# Patient Record
Sex: Female | Born: 1980
Health system: Southern US, Community
[De-identification: ages and names within clinical notes are randomized; demographics above are authoritative.]

## PROBLEM LIST (undated history)

## (undated) DIAGNOSIS — R519 Headache, unspecified: Secondary | ICD-10-CM

## (undated) DIAGNOSIS — Z87442 Personal history of urinary calculi: Secondary | ICD-10-CM

## (undated) DIAGNOSIS — D649 Anemia, unspecified: Secondary | ICD-10-CM

## (undated) DIAGNOSIS — N393 Stress incontinence (female) (male): Secondary | ICD-10-CM

## (undated) DIAGNOSIS — I10 Essential (primary) hypertension: Secondary | ICD-10-CM

## (undated) DIAGNOSIS — F419 Anxiety disorder, unspecified: Secondary | ICD-10-CM

## (undated) DIAGNOSIS — R55 Syncope and collapse: Secondary | ICD-10-CM

## (undated) DIAGNOSIS — I2699 Other pulmonary embolism without acute cor pulmonale: Secondary | ICD-10-CM

## (undated) HISTORY — DX: Essential (primary) hypertension: I10

## (undated) HISTORY — DX: Syncope and collapse: R55

## (undated) HISTORY — PX: TUBAL LIGATION: SHX77

## (undated) HISTORY — DX: Headache, unspecified: R51.9

---
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
## 1998-01-18 ENCOUNTER — Inpatient Hospital Stay (HOSPITAL_COMMUNITY): Admission: AD | Admit: 1998-01-18 | Discharge: 1998-01-18 | Payer: Self-pay | Admitting: Obstetrics

## 1998-01-31 ENCOUNTER — Inpatient Hospital Stay (HOSPITAL_COMMUNITY): Admission: AD | Admit: 1998-01-31 | Discharge: 1998-02-03 | Payer: Self-pay | Admitting: *Deleted

## 1998-01-31 ENCOUNTER — Inpatient Hospital Stay (HOSPITAL_COMMUNITY): Admission: AD | Admit: 1998-01-31 | Discharge: 1998-01-31 | Payer: Self-pay | Admitting: *Deleted

## 1998-02-06 ENCOUNTER — Inpatient Hospital Stay (HOSPITAL_COMMUNITY): Admission: AD | Admit: 1998-02-06 | Discharge: 1998-02-06 | Payer: Self-pay | Admitting: Obstetrics

## 2000-05-09 ENCOUNTER — Emergency Department (HOSPITAL_COMMUNITY): Admission: EM | Admit: 2000-05-09 | Discharge: 2000-05-09 | Payer: Self-pay | Admitting: Emergency Medicine

## 2000-05-18 ENCOUNTER — Emergency Department (HOSPITAL_COMMUNITY): Admission: EM | Admit: 2000-05-18 | Discharge: 2000-05-18 | Payer: Self-pay | Admitting: Emergency Medicine

## 2000-10-09 ENCOUNTER — Encounter: Payer: Self-pay | Admitting: Emergency Medicine

## 2000-10-09 ENCOUNTER — Emergency Department (HOSPITAL_COMMUNITY): Admission: EM | Admit: 2000-10-09 | Discharge: 2000-10-10 | Payer: Self-pay | Admitting: Emergency Medicine

## 2001-01-02 ENCOUNTER — Emergency Department (HOSPITAL_COMMUNITY): Admission: EM | Admit: 2001-01-02 | Discharge: 2001-01-02 | Payer: Self-pay | Admitting: Emergency Medicine

## 2001-06-12 ENCOUNTER — Inpatient Hospital Stay (HOSPITAL_COMMUNITY): Admission: AD | Admit: 2001-06-12 | Discharge: 2001-06-12 | Payer: Self-pay | Admitting: *Deleted

## 2001-06-30 ENCOUNTER — Encounter: Payer: Self-pay | Admitting: *Deleted

## 2001-06-30 ENCOUNTER — Inpatient Hospital Stay (HOSPITAL_COMMUNITY): Admission: AD | Admit: 2001-06-30 | Discharge: 2001-06-30 | Payer: Self-pay | Admitting: *Deleted

## 2001-10-22 ENCOUNTER — Encounter (INDEPENDENT_AMBULATORY_CARE_PROVIDER_SITE_OTHER): Payer: Self-pay | Admitting: *Deleted

## 2001-10-22 ENCOUNTER — Inpatient Hospital Stay (HOSPITAL_COMMUNITY): Admission: AD | Admit: 2001-10-22 | Discharge: 2001-10-24 | Payer: Self-pay | Admitting: *Deleted

## 2002-04-14 ENCOUNTER — Emergency Department (HOSPITAL_COMMUNITY): Admission: EM | Admit: 2002-04-14 | Discharge: 2002-04-14 | Payer: Self-pay

## 2002-08-12 ENCOUNTER — Emergency Department (HOSPITAL_COMMUNITY): Admission: EM | Admit: 2002-08-12 | Discharge: 2002-08-13 | Payer: Self-pay | Admitting: Emergency Medicine

## 2002-08-13 ENCOUNTER — Emergency Department (HOSPITAL_COMMUNITY): Admission: EM | Admit: 2002-08-13 | Discharge: 2002-08-14 | Payer: Self-pay | Admitting: Emergency Medicine

## 2003-10-20 ENCOUNTER — Ambulatory Visit (HOSPITAL_COMMUNITY): Admission: RE | Admit: 2003-10-20 | Discharge: 2003-10-20 | Payer: Self-pay | Admitting: *Deleted

## 2004-02-12 HISTORY — PX: TUBAL LIGATION: SHX77

## 2004-03-20 ENCOUNTER — Encounter (INDEPENDENT_AMBULATORY_CARE_PROVIDER_SITE_OTHER): Payer: Self-pay | Admitting: *Deleted

## 2004-03-20 ENCOUNTER — Inpatient Hospital Stay (HOSPITAL_COMMUNITY): Admission: AD | Admit: 2004-03-20 | Discharge: 2004-03-22 | Payer: Self-pay | Admitting: *Deleted

## 2004-03-20 ENCOUNTER — Ambulatory Visit: Payer: Self-pay | Admitting: *Deleted

## 2006-02-11 HISTORY — PX: BREAST ENHANCEMENT SURGERY: SHX7

## 2006-02-11 HISTORY — PX: AUGMENTATION MAMMAPLASTY: SUR837

## 2006-03-07 ENCOUNTER — Other Ambulatory Visit: Admission: RE | Admit: 2006-03-07 | Discharge: 2006-03-07 | Payer: Self-pay | Admitting: Family Medicine

## 2006-10-07 ENCOUNTER — Emergency Department (HOSPITAL_COMMUNITY): Admission: EM | Admit: 2006-10-07 | Discharge: 2006-10-07 | Payer: Self-pay | Admitting: Emergency Medicine

## 2006-11-03 ENCOUNTER — Emergency Department (HOSPITAL_COMMUNITY): Admission: EM | Admit: 2006-11-03 | Discharge: 2006-11-03 | Payer: Self-pay | Admitting: Emergency Medicine

## 2008-01-20 ENCOUNTER — Emergency Department (HOSPITAL_COMMUNITY): Admission: EM | Admit: 2008-01-20 | Discharge: 2008-01-21 | Payer: Self-pay | Admitting: Emergency Medicine

## 2009-01-18 ENCOUNTER — Other Ambulatory Visit: Admission: RE | Admit: 2009-01-18 | Discharge: 2009-01-18 | Payer: Self-pay | Admitting: Family Medicine

## 2009-10-20 ENCOUNTER — Other Ambulatory Visit: Admission: RE | Admit: 2009-10-20 | Discharge: 2009-10-20 | Payer: Self-pay | Admitting: Obstetrics and Gynecology

## 2010-06-29 NOTE — Op Note (Signed)
NAMEJEFFRIE, STANDER                  ACCOUNT NO.:  [id number]   MEDICAL RECORD NO.:  [id number]          PATIENT TYPE:  INP   LOCATION:  9136                          FACILITY:  [facility]   PHYSICIAN:  Conni Elliot, M.D.DATE OF BIRTH:  [birth year]   DATE OF PROCEDURE:  03/20/2004  DATE OF DISCHARGE:                                 OPERATIVE REPORT   PREOPERATIVE DIAGNOSIS:  Desire for surgical sterilization.   POSTOPERATIVE DIAGNOSIS:  Desire for surgical sterilization.   OPERATION:  Modified bilateral Pomeroy tubal ligation.   OPERATOR:  Conni Elliot, M.D.   ANESTHESIA:  Continuous lumbar epidural.   OPERATIVE FINDINGS:  Uterus and tubes were normal for postgravid state.   DESCRIPTION OF PROCEDURE:  After placing the patient under continuous lumbar  epidural anesthetic, the patient supine, abdomen was prepped and draped in a  sterile fashion.  Bladder was emptied by transurethral cath.  The abdomen  was entered through a periumbilical incision.  Incision was made through the  skin through layers of fascia.  Peritoneal cavity entered.  The right  fallopian tube was identified and grasped with a Babcock clamp, followed to  its fimbriated end.  A segment of tube was brought into the operative field,  doubly suture ligated, and an approximately 2 cm segment was excised.  Hemostasis was adequate.  A similar procedure done on the opposite side.  Hemostasis again adequate.  Anterior peritoneum, fascia, and skin were  closed in routine fashion.  Estimated blood loss less than 10 mL.  Instrument counts correct.      ASG/MEDQ  D:  03/20/2004  T:  03/20/2004  Job:  [id number]

## 2010-10-04 ENCOUNTER — Other Ambulatory Visit: Payer: Self-pay | Admitting: Family Medicine

## 2010-10-04 ENCOUNTER — Other Ambulatory Visit (HOSPITAL_COMMUNITY)
Admission: RE | Admit: 2010-10-04 | Discharge: 2010-10-04 | Disposition: A | Discharge status: Home / Self Care | Payer: 59 | Source: Ambulatory Visit | Attending: Family Medicine | Admitting: Family Medicine

## 2010-10-04 DIAGNOSIS — Z113 Encounter for screening for infections with a predominantly sexual mode of transmission: Secondary | ICD-10-CM | POA: Insufficient documentation

## 2010-10-04 DIAGNOSIS — Z01419 Encounter for gynecological examination (general) (routine) without abnormal findings: Secondary | ICD-10-CM | POA: Insufficient documentation

## 2012-12-24 ENCOUNTER — Emergency Department (INDEPENDENT_AMBULATORY_CARE_PROVIDER_SITE_OTHER)
Admission: EM | Admit: 2012-12-24 | Discharge: 2012-12-24 | Disposition: A | Discharge status: Home / Self Care | Payer: Self-pay | Source: Home / Self Care | Attending: Emergency Medicine | Admitting: Emergency Medicine

## 2012-12-24 ENCOUNTER — Encounter (HOSPITAL_COMMUNITY): Payer: Self-pay | Admitting: Emergency Medicine

## 2012-12-24 DIAGNOSIS — J029 Acute pharyngitis, unspecified: Secondary | ICD-10-CM

## 2012-12-24 LAB — POCT RAPID STREP A: Streptococcus, Group A Screen (Direct): NEGATIVE

## 2012-12-24 MED ORDER — NAPROXEN 500 MG PO TABS: 500.0000 mg | ORAL_TABLET | Freq: Two times a day (BID) | ORAL | Status: DC

## 2012-12-24 MED ORDER — FEXOFENADINE-PSEUDOEPHED ER 60-120 MG PO TB12: 1.0000 | ORAL_TABLET | Freq: Two times a day (BID) | ORAL | Status: DC

## 2012-12-24 NOTE — ED Provider Notes (Signed)
Chief Complaint:   Chief Complaint  Patient presents with  . Sore Throat    History of Present Illness:   Kathryn Crawford is a 32 year old female who has had a three-day history of sore throat, pain on swallowing, postnasal drip, and nasal congestion with clear to yellow drainage with sinus pressure. She denies any fever, chills, sweats, or muscle aches. She has had no headache, cough, or GI symptoms. No known exposure to strep or to mono.  Review of Systems:  Other than noted above, the patient denies any of the following symptoms: Systemic:  No fevers, chills, sweats, weight loss or gain, fatigue, or tiredness. Eye:  No redness or discharge. ENT:  No ear pain, drainage, headache, nasal congestion, drainage, sinus pressure, difficulty swallowing, or sore throat. Neck:  No neck pain or swollen glands. Lungs:  No cough, sputum production, hemoptysis, wheezing, chest tightness, shortness of breath or chest pain. GI:  No abdominal pain, nausea, vomiting or diarrhea.  PMFSH:  Past medical history, family history, social history, meds, and allergies were reviewed.  Physical Exam:   Vital signs:  BP 113/82  Pulse 94  Temp(Src) 98 F (36.7 C) (Oral)  Resp 16  SpO2 99%  LMP 12/03/2012 General:  Alert and oriented.  In no distress.  Skin warm and dry. Eye:  No conjunctival injection or drainage. Lids were normal. ENT:  TMs and canals were normal, without erythema or inflammation.  Nasal mucosa was clear and uncongested, without drainage.  Mucous membranes were moist.  Pharynx was erythematous with no exudate or drainage.  There were no oral ulcerations or lesions. Neck:  Supple, no adenopathy, tenderness or mass. Lungs:  No respiratory distress.  Lungs were clear to auscultation, without wheezes, rales or rhonchi.  Breath sounds were clear and equal bilaterally.  Heart:  Regular rhythm, without gallops, murmers or rubs. Skin:  Clear, warm, and dry, without rash or lesions.  Labs:   Results  for orders placed during the hospital encounter of 12/24/12  POCT RAPID STREP A ([facility] ONLY)      Result Value Range   Streptococcus, Group A Screen (Direct) NEGATIVE  NEGATIVE    Assessment:  The encounter diagnosis was Viral pharyngitis.  No indication for antibiotics.  Plan:   1.  Meds:  The following meds were prescribed:   New Prescriptions   FEXOFENADINE-PSEUDOEPHEDRINE (ALLEGRA-D) 60-120 MG PER TABLET    Take 1 tablet by mouth every 12 (twelve) hours.   NAPROXEN (NAPROSYN) 500 MG TABLET    Take 1 tablet (500 mg total) by mouth 2 (two) times daily.    2.  Patient Education/Counseling:  The patient was given appropriate handouts, self care instructions, and instructed in symptomatic relief.   3.  Follow up:  The patient was told to follow up if no better in one week, if becoming worse in any way, and given some red flag symptoms such as fever or difficulty breathing which would prompt immediate return.  Follow up here if no better in one week.      Reuben Likes, MD 12/24/12 [phone]

## 2012-12-24 NOTE — ED Notes (Signed)
Pt c/o sore throat onset 2 days Sxs include: odynophagia, nasal congestion Denies: f/v/n/d Alert w/no signs of acute distress.

## 2012-12-26 LAB — CULTURE, GROUP A STREP

## 2013-10-14 ENCOUNTER — Other Ambulatory Visit: Payer: Self-pay | Admitting: Family Medicine

## 2013-10-14 ENCOUNTER — Other Ambulatory Visit (HOSPITAL_COMMUNITY)
Admission: RE | Admit: 2013-10-14 | Discharge: 2013-10-14 | Disposition: A | Discharge status: Home / Self Care | Payer: 59 | Source: Ambulatory Visit | Attending: Family Medicine | Admitting: Family Medicine

## 2013-10-14 DIAGNOSIS — Z124 Encounter for screening for malignant neoplasm of cervix: Secondary | ICD-10-CM | POA: Diagnosis present

## 2013-10-14 DIAGNOSIS — Z1151 Encounter for screening for human papillomavirus (HPV): Secondary | ICD-10-CM | POA: Diagnosis present

## 2013-10-14 DIAGNOSIS — Z113 Encounter for screening for infections with a predominantly sexual mode of transmission: Secondary | ICD-10-CM | POA: Insufficient documentation

## 2013-10-19 LAB — CYTOLOGY - PAP

## 2014-11-30 ENCOUNTER — Ambulatory Visit: Payer: Self-pay | Admitting: Licensed Clinical Social Worker

## 2014-12-07 ENCOUNTER — Ambulatory Visit (INDEPENDENT_AMBULATORY_CARE_PROVIDER_SITE_OTHER): Payer: 59 | Admitting: Licensed Clinical Social Worker

## 2014-12-07 DIAGNOSIS — F40228 Other natural environment type phobia: Secondary | ICD-10-CM | POA: Diagnosis not present

## 2015-01-11 ENCOUNTER — Ambulatory Visit: Payer: 59 | Admitting: Licensed Clinical Social Worker

## 2015-01-17 ENCOUNTER — Ambulatory Visit (INDEPENDENT_AMBULATORY_CARE_PROVIDER_SITE_OTHER): Payer: 59 | Admitting: Licensed Clinical Social Worker

## 2015-01-17 DIAGNOSIS — F40228 Other natural environment type phobia: Secondary | ICD-10-CM

## 2015-01-30 ENCOUNTER — Ambulatory Visit: Payer: Self-pay | Admitting: Licensed Clinical Social Worker

## 2016-04-19 DIAGNOSIS — N898 Other specified noninflammatory disorders of vagina: Secondary | ICD-10-CM | POA: Diagnosis not present

## 2016-04-28 ENCOUNTER — Encounter (HOSPITAL_COMMUNITY): Payer: Self-pay | Admitting: Emergency Medicine

## 2016-04-28 ENCOUNTER — Ambulatory Visit (INDEPENDENT_AMBULATORY_CARE_PROVIDER_SITE_OTHER): Payer: Worker's Compensation

## 2016-04-28 ENCOUNTER — Ambulatory Visit (HOSPITAL_COMMUNITY)
Admission: EM | Admit: 2016-04-28 | Discharge: 2016-04-28 | Disposition: A | Discharge status: Home / Self Care | Payer: Worker's Compensation | Attending: Family Medicine | Admitting: Family Medicine

## 2016-04-28 DIAGNOSIS — S9782XA Crushing injury of left foot, initial encounter: Secondary | ICD-10-CM | POA: Diagnosis not present

## 2016-04-28 DIAGNOSIS — S90212A Contusion of left great toe with damage to nail, initial encounter: Secondary | ICD-10-CM

## 2016-04-28 MED ORDER — HYDROCODONE-ACETAMINOPHEN 5-325 MG PO TABS: 1.0000 | ORAL_TABLET | Freq: Four times a day (QID) | ORAL | 0 refills | Status: DC | PRN

## 2016-04-28 NOTE — ED Provider Notes (Signed)
[facility]    CSN: [id number] Arrival date & time: 04/28/16  1928     History   Chief Complaint Chief Complaint  Patient presents with  . Foot Pain    HPI Kathryn Crawford is a 36 y.o. female.   The patient presented to the [facility] with a complaint of left foot pain secondary to it getting run over with a floor buffer today.  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 She had a buffer run over her left great toe while working the nursing home shift. She's had some throbbing pain under the nail with discoloration      History reviewed. No pertinent past medical history.  There are no active problems to display for this patient.   Past Surgical History:  Procedure Laterality Date  . TUBAL LIGATION Bilateral     OB History    No data available       Home Medications    Prior to Admission medications   Medication Sig Start Date End Date Taking? Authorizing Provider  HYDROcodone-acetaminophen (NORCO) 5-325 MG tablet Take 1 tablet by mouth every 6 (six) hours as needed for moderate pain. 04/28/16   Elvina Sidle, MD    Family History History reviewed. No pertinent family history.  Social History Social History  Substance Use Topics  . Smoking status: Never Smoker  . Smokeless tobacco: Not on file  . Alcohol use Yes     Allergies   Patient has no known allergies.   Review of Systems Review of Systems  Musculoskeletal: Positive for gait problem.  All other systems reviewed and are negative.    Physical Exam Triage Vital Signs ED Triage Vitals  Enc Vitals Group     BP 04/28/16 2010 123/71     Pulse Rate 04/28/16 2010 85     Resp 04/28/16 2010 18     Temp 04/28/16 2010 98.3 F (36.8 C)     Temp Source 04/28/16 2010 Oral     SpO2 04/28/16 2010 96 %     Weight --      Height --      Head Circumference --      Peak Flow --      Pain Score 04/28/16 2009 6     Pain Loc --      Pain Edu?  --      Excl. in GC? --    No data found.   Updated Vital Signs BP 123/71 (BP Location: Right Arm)   Pulse 85   Temp 98.3 F (36.8 C) (Oral)   Resp 18   SpO2 96%    Physical Exam  Constitutional: She is oriented to person, place, and time. She appears well-developed and well-nourished.  HENT:  Right Ear: External ear normal.  Left Ear: External ear normal.  Mouth/Throat: Oropharynx is clear and moist.  Eyes: Conjunctivae and EOM are normal. Pupils are equal, round, and reactive to light.  Neck: Normal range of motion. Neck supple.  Pulmonary/Chest: Effort normal.  Musculoskeletal: She exhibits tenderness. She exhibits no deformity.  Subungual hematoma of left great toe with mild swelling and ecchymosis at the tip.  Neurological: She is alert and oriented to person, place, and time.  Skin: Skin is warm and dry.  Nursing note and vitals reviewed.    UC Treatments / Results  Labs (all labs ordered are listed, but only abnormal results are displayed) Labs Reviewed - No data  to display  EKG  EKG Interpretation None       Radiology No fractures seen Procedures Procedures (including critical care time)  Medications Ordered in UC Medications - No data to display   Initial Impression / Assessment and Plan / UC Course  I have reviewed the triage vital signs and the nursing notes.  Pertinent labs & imaging results that were available during my care of the patient were reviewed by me and considered in my medical decision making (see chart for details).     Final Clinical Impressions(s) / UC Diagnoses   Final diagnoses:  Crushing injury of left foot, initial encounter  Subungual hematoma of great toe of left foot, initial encounter    New Prescriptions New Prescriptions   HYDROCODONE-ACETAMINOPHEN (NORCO) 5-325 MG TABLET    Take 1 tablet by mouth every 6 (six) hours as needed for moderate pain.     Elvina SidleKurt Agapita Savarino, MD 04/28/16 2113

## 2016-04-28 NOTE — Discharge Instructions (Signed)
No fracture seen on x-ray. We've given you some pain medicine to get through the night the next few nights. Expect the pain to gradually diminish over the next week.

## 2016-04-28 NOTE — ED Triage Notes (Signed)
The patient presented to the [facility] with a complaint of left foot pain secondary to it getting run over with a floor buffer today.

## 2016-05-08 ENCOUNTER — Other Ambulatory Visit: Payer: Self-pay | Admitting: Obstetrics & Gynecology

## 2016-05-08 ENCOUNTER — Other Ambulatory Visit (HOSPITAL_COMMUNITY)
Admission: RE | Admit: 2016-05-08 | Discharge: 2016-05-08 | Disposition: A | Discharge status: Home / Self Care | Payer: Self-pay | Source: Ambulatory Visit | Attending: Obstetrics & Gynecology | Admitting: Obstetrics & Gynecology

## 2016-05-08 DIAGNOSIS — N889 Noninflammatory disorder of cervix uteri, unspecified: Secondary | ICD-10-CM | POA: Diagnosis not present

## 2016-05-08 DIAGNOSIS — N760 Acute vaginitis: Secondary | ICD-10-CM | POA: Diagnosis not present

## 2016-05-08 DIAGNOSIS — Z01419 Encounter for gynecological examination (general) (routine) without abnormal findings: Secondary | ICD-10-CM | POA: Insufficient documentation

## 2016-05-08 DIAGNOSIS — N3946 Mixed incontinence: Secondary | ICD-10-CM | POA: Diagnosis not present

## 2016-05-08 DIAGNOSIS — Z124 Encounter for screening for malignant neoplasm of cervix: Secondary | ICD-10-CM | POA: Diagnosis not present

## 2016-05-10 LAB — CYTOLOGY - PAP: Diagnosis: NEGATIVE

## 2016-06-19 DIAGNOSIS — N898 Other specified noninflammatory disorders of vagina: Secondary | ICD-10-CM | POA: Diagnosis not present

## 2016-06-19 DIAGNOSIS — N889 Noninflammatory disorder of cervix uteri, unspecified: Secondary | ICD-10-CM | POA: Diagnosis not present

## 2016-07-16 DIAGNOSIS — N3946 Mixed incontinence: Secondary | ICD-10-CM | POA: Diagnosis not present

## 2016-08-21 DIAGNOSIS — Z202 Contact with and (suspected) exposure to infections with a predominantly sexual mode of transmission: Secondary | ICD-10-CM | POA: Diagnosis not present

## 2016-11-26 DIAGNOSIS — N898 Other specified noninflammatory disorders of vagina: Secondary | ICD-10-CM | POA: Diagnosis not present

## 2016-11-26 DIAGNOSIS — Z9851 Tubal ligation status: Secondary | ICD-10-CM | POA: Diagnosis not present

## 2016-11-26 DIAGNOSIS — Z3009 Encounter for other general counseling and advice on contraception: Secondary | ICD-10-CM | POA: Diagnosis not present

## 2016-11-26 DIAGNOSIS — Z8742 Personal history of other diseases of the female genital tract: Secondary | ICD-10-CM | POA: Diagnosis not present

## 2016-11-26 DIAGNOSIS — N3946 Mixed incontinence: Secondary | ICD-10-CM | POA: Diagnosis not present

## 2016-11-26 DIAGNOSIS — Z113 Encounter for screening for infections with a predominantly sexual mode of transmission: Secondary | ICD-10-CM | POA: Diagnosis not present

## 2017-04-07 ENCOUNTER — Ambulatory Visit (INDEPENDENT_AMBULATORY_CARE_PROVIDER_SITE_OTHER): Payer: Self-pay | Admitting: Nurse Practitioner

## 2017-04-07 ENCOUNTER — Encounter: Payer: Self-pay | Admitting: Nurse Practitioner

## 2017-04-07 VITALS — BP 120/90 | HR 89 | Wt 194.2 lb

## 2017-04-07 DIAGNOSIS — J069 Acute upper respiratory infection, unspecified: Secondary | ICD-10-CM

## 2017-04-07 MED ORDER — BENZONATATE 100 MG PO CAPS: 100.0000 mg | ORAL_CAPSULE | Freq: Three times a day (TID) | ORAL | 0 refills | Status: AC | PRN

## 2017-04-07 MED ORDER — FLUTICASONE PROPIONATE 50 MCG/ACT NA SUSP: 2.0000 | Freq: Every day | NASAL | 0 refills | Status: DC

## 2017-04-07 NOTE — Progress Notes (Signed)
Patient ID: Kathryn Crawford, female   DOB: 1980-05-25, 37 y.o.   MRN: [id number] Cough: Patient complains of nonproductive cough.  Symptoms began 3 days ago.  The cough is non-productive, without wheezing, dyspnea or hemoptysis and is aggravated by reclining position Associated symptoms include:postnasal drip and nasal congestion. Patient does not have new pets. Patient does not have a history of asthma. Patient does not have a history of environmental allergens. Patient has not recent travel. Patient does not have a history of smoking.   Review of Systems  Constitutional: Negative.   HENT: Positive for congestion. Negative for ear discharge, ear pain, sinus pain and sore throat.   Eyes: Negative.   Respiratory: Positive for cough. Negative for hemoptysis, sputum production, shortness of breath and wheezing.   Cardiovascular: Negative.   Gastrointestinal: Negative.   Skin: Negative.   Neurological: Negative.   Endo/Heme/Allergies: Negative.     Physical Exam  Constitutional: She is oriented to person, place, and time and well-developed, well-nourished, and in no distress. No distress.  HENT:  Head: Normocephalic and atraumatic.  Right Ear: External ear normal.  Left Ear: External ear normal.  Eyes: Conjunctivae and EOM are normal. Pupils are equal, round, and reactive to light.  Neck: Normal range of motion. Neck supple. No tracheal deviation present. No thyromegaly present.  Cardiovascular: Normal rate, regular rhythm and normal heart sounds.  Pulmonary/Chest: Effort normal and breath sounds normal. She has no wheezes.  Abdominal: Soft. Bowel sounds are normal. She exhibits no distension. There is no tenderness.  Neurological: She is alert and oriented to person, place, and time.  Skin: Skin is warm and dry. No rash noted.  Psychiatric: Affect normal.   Assessment  Viral Upper Respiratory Infection  Plan  Viral Upper Respiratory Infection Meds ordered this encounter  Medications   . benzonatate (TESSALON PERLES) 100 MG capsule    Sig: Take 1 capsule (100 mg total) by mouth 3 (three) times daily as needed for up to 10 days for cough.    Dispense:  30 capsule    Refill:  0    Order Specific Question:   Supervising Provider    Answer:   Stacie GlazeJENKINS, JOHN E [[id number]]  . fluticasone (FLONASE) 50 MCG/ACT nasal spray    Sig: Place 2 sprays into both nostrils daily for 10 days.    Dispense:  16 g    Refill:  0    Order Specific Question:   Supervising Provider    Answer:   Stacie GlazeJENKINS, JOHN E [phone][[id number]]   Patient provided education regarding Upper Respiratory Infection.  Patient encouraged to use humidifier at home.  Increase fluids.  Tylenol or Ibuprofen for pain, fever or general discomfort.  Also recommended to sleep elevated on 2 pillows.  Patient will follow up as needed.

## 2017-04-07 NOTE — Patient Instructions (Addendum)
Upper Respiratory Infection, Adult Most upper respiratory infections (URIs) are a viral infection of the air passages leading to the lungs. A URI affects the nose, throat, and upper air passages. The most common type of URI is nasopharyngitis and is typically referred to as "the common cold." URIs run their course and usually go away on their own. Most of the time, a URI does not require medical attention, but sometimes a bacterial infection in the upper airways can follow a viral infection. This is called a secondary infection. Sinus and middle ear infections are common types of secondary upper respiratory infections. Bacterial pneumonia can also complicate a URI. A URI can worsen asthma and chronic obstructive pulmonary disease (COPD). Sometimes, these complications can require emergency medical care and may be life threatening. What are the causes? Almost all URIs are caused by viruses. A virus is a type of germ and can spread from one person to another. What increases the risk? You may be at risk for a URI if:  You smoke.  You have chronic heart or lung disease.  You have a weakened defense (immune) system.  You are very young or very old.  You have nasal allergies or asthma.  You work in crowded or poorly ventilated areas.  You work in health care facilities or schools.  What are the signs or symptoms? Symptoms typically develop 2-3 days after you come in contact with a cold virus. Most viral URIs last 7-10 days. However, viral URIs from the influenza virus (flu virus) can last 14-18 days and are typically more severe. Symptoms may include:  Runny or stuffy (congested) nose.  Sneezing.  Cough.  Sore throat.  Headache.  Fatigue.  Fever.  Loss of appetite.  Pain in your forehead, behind your eyes, and over your cheekbones (sinus pain).  Muscle aches.  How is this diagnosed? Your health care provider may diagnose a URI by:  Physical exam.  Tests to check that your  symptoms are not due to another condition such as: ? Strep throat. ? Sinusitis. ? Pneumonia. ? Asthma.  How is this treated? A URI goes away on its own with time. It cannot be cured with medicines, but medicines may be prescribed or recommended to relieve symptoms. Medicines may help:  Reduce your fever.  Reduce your cough.  Relieve nasal congestion.  Follow these instructions at home:  Take medicines only as directed by your health care provider.  Gargle warm saltwater or take cough drops to comfort your throat as directed by your health care provider.  Use a warm mist humidifier or inhale steam from a shower to increase air moisture. This may make it easier to breathe.  Drink enough fluid to keep your urine clear or pale yellow.  Eat soups and other clear broths and maintain good nutrition.  Rest as needed.  Return to work when your temperature has returned to normal or as your health care provider advises. You may need to stay home longer to avoid infecting others. You can also use a face mask and careful hand washing to prevent spread of the virus.  Increase the usage of your inhaler if you have asthma.  Do not use any tobacco products, including cigarettes, chewing tobacco, or electronic cigarettes. If you need help quitting, ask your health care provider. How is this prevented? The best way to protect yourself from getting a cold is to practice good hygiene.  Avoid oral or hand contact with people with cold symptoms.  Wash your   hands often if contact occurs.  There is no clear evidence that vitamin C, vitamin E, echinacea, or exercise reduces the chance of developing a cold. However, it is always recommended to get plenty of rest, exercise, and practice good nutrition. Contact a health care provider if:  You are getting worse rather than better.  Your symptoms are not controlled by medicine.  You have chills.  You have worsening shortness of breath.  You have  brown or red mucus.  You have yellow or brown nasal discharge.  You have pain in your face, especially when you bend forward.  You have a fever.  You have swollen neck glands.  You have pain while swallowing.  You have white areas in the back of your throat. Get help right away if:  You have severe or persistent: ? Headache. ? Ear pain. ? Sinus pain. ? Chest pain.  You have chronic lung disease and any of the following: ? Wheezing. ? Prolonged cough. ? Coughing up blood. ? A change in your usual mucus.  You have a stiff neck.  You have changes in your: ? Vision. ? Hearing. ? Thinking. ? Mood. This information is not intended to replace advice given to you by your health care provider. Make sure you discuss any questions you have with your health care provider. Document Released: 07/24/2000 Document Revised: 10/01/2015 Document Reviewed: 05/05/2013 Elsevier Interactive Patient Education  2018 [employer]vier Inc.  

## 2017-05-04 ENCOUNTER — Other Ambulatory Visit: Payer: Self-pay | Admitting: Nurse Practitioner

## 2017-05-14 ENCOUNTER — Ambulatory Visit (INDEPENDENT_AMBULATORY_CARE_PROVIDER_SITE_OTHER): Payer: Self-pay | Admitting: Nurse Practitioner

## 2017-05-14 ENCOUNTER — Encounter: Payer: Self-pay | Admitting: Nurse Practitioner

## 2017-05-14 VITALS — BP 102/70 | HR 71 | Temp 97.6°F | Wt 195.0 lb

## 2017-05-14 DIAGNOSIS — R300 Dysuria: Secondary | ICD-10-CM

## 2017-05-14 DIAGNOSIS — N390 Urinary tract infection, site not specified: Secondary | ICD-10-CM

## 2017-05-14 LAB — POC URINALSYSI DIPSTICK (AUTOMATED)
Bilirubin, UA: NEGATIVE
Blood, UA: NEGATIVE
Glucose, UA: NEGATIVE
Ketones, UA: NEGATIVE
Nitrite, UA: NEGATIVE
Protein, UA: NEGATIVE
Spec Grav, UA: 1.02 (ref 1.010–1.025)
Urobilinogen, UA: NEGATIVE E.U./dL — AB
pH, UA: 6 (ref 5.0–8.0)

## 2017-05-14 MED ORDER — PHENAZOPYRIDINE HCL 100 MG PO TABS: 100.0000 mg | ORAL_TABLET | Freq: Three times a day (TID) | ORAL | 0 refills | Status: AC | PRN

## 2017-05-14 MED ORDER — SULFAMETHOXAZOLE-TRIMETHOPRIM 800-160 MG PO TABS: 1.0000 | ORAL_TABLET | Freq: Two times a day (BID) | ORAL | 0 refills | Status: AC

## 2017-05-14 NOTE — Patient Instructions (Signed)

## 2017-05-14 NOTE — Progress Notes (Signed)
Subjective:    Kathryn Crawford is a 37 y.o. female who complains of abnormal smelling urine, burning with urination and frequency for 3 days. Patient states she also has vaginal discharge.  Patient denies back pain, congestion, fever and stomach ache.  Patient does not have a history of recurrent UTI.  Patient does not have a history of pyelonephritis. The following portions of the patient's history were reviewed and updated as appropriate: allergies, current medications and past medical history. Review of Systems Constitutional: negative Eyes: negative Ears, nose, mouth, throat, and face: negative Respiratory: negative Cardiovascular: negative Gastrointestinal: negative Genitourinary:positive for vaginal discharge, dysuria and frequency, negative for abnormal menstrual periods and sexual problems, hematuria, hesitancy, nocturia and urinary incontinence Neurological: negative    Objective:    BP 102/70   Pulse 71   Temp 97.6 F (36.4 C)   Wt 195 lb (88.5 kg)   SpO2 98%  General: alert and cooperative  Abdomen: soft, non-tender, without masses or organomegaly in the entire abdomen  Back: back muscles are full ROM, CVA tenderness absent  GU: defer exam   Laboratory:  Urine dipstick shows sp gravity 1.025, negative for glucose, negative for hemoglobin, negative for ketones, large for leukocyte esterase, negative for nitrites, negative for protein and negative for urobilinogen.   Micro exam: not done.    Assessment:    Acute cystitis    Plan: Plan:    1. Medications: TMP/SMX and Pyridium 2. Maintain adequate hydration 3. Follow up if symptoms not improving, and prn.   4. Patient instructed to void approximately 15-30 minutes after sexual intercourse.   5. Go to ER if fever, chills, back pain or other concerns. 6. Follow up as needed. Patient education provided.  Patient verbalizes understanding and had no questions at time of discharge.  Meds ordered this encounter   Medications  . sulfamethoxazole-trimethoprim (BACTRIM DS) 800-160 MG tablet    Sig: Take 1 tablet by mouth 2 (two) times daily for 3 days.    Dispense:  6 tablet    Refill:  0    Order Specific Question:   Supervising Provider    Answer:   Stacie GlazeJENKINS, JOHN E [[id number]]  . phenazopyridine (PYRIDIUM) 100 MG tablet    Sig: Take 1 tablet (100 mg total) by mouth 3 (three) times daily as needed for up to 3 days for pain.    Dispense:  10 tablet    Refill:  0    Order Specific Question:   Supervising Provider    Answer:   Stacie GlazeJENKINS, JOHN E [phone][[id number]]

## 2017-05-23 ENCOUNTER — Telehealth: Payer: Self-pay

## 2017-05-23 NOTE — Telephone Encounter (Signed)
Called and spoke wit pt and she states she is feeling better.

## 2017-09-19 ENCOUNTER — Encounter (HOSPITAL_COMMUNITY)
Admission: EM | Disposition: A | Discharge status: Home / Self Care | Payer: Self-pay | Source: Home / Self Care | Attending: Internal Medicine

## 2017-09-19 ENCOUNTER — Emergency Department (HOSPITAL_COMMUNITY): Payer: PRIVATE HEALTH INSURANCE

## 2017-09-19 ENCOUNTER — Inpatient Hospital Stay (HOSPITAL_COMMUNITY): Payer: PRIVATE HEALTH INSURANCE

## 2017-09-19 ENCOUNTER — Inpatient Hospital Stay (HOSPITAL_COMMUNITY): Payer: PRIVATE HEALTH INSURANCE | Admitting: Anesthesiology

## 2017-09-19 ENCOUNTER — Encounter (HOSPITAL_COMMUNITY): Payer: Self-pay | Admitting: Emergency Medicine

## 2017-09-19 ENCOUNTER — Other Ambulatory Visit: Payer: Self-pay

## 2017-09-19 ENCOUNTER — Inpatient Hospital Stay (HOSPITAL_COMMUNITY)
Admission: EM | Admit: 2017-09-19 | Discharge: 2017-09-20 | DRG: 661 | Disposition: A | Discharge status: Home / Self Care | Payer: PRIVATE HEALTH INSURANCE | Attending: Internal Medicine | Admitting: Internal Medicine

## 2017-09-19 DIAGNOSIS — N179 Acute kidney failure, unspecified: Secondary | ICD-10-CM | POA: Diagnosis present

## 2017-09-19 DIAGNOSIS — N136 Pyonephrosis: Secondary | ICD-10-CM | POA: Diagnosis present

## 2017-09-19 DIAGNOSIS — N201 Calculus of ureter: Secondary | ICD-10-CM

## 2017-09-19 DIAGNOSIS — N390 Urinary tract infection, site not specified: Secondary | ICD-10-CM | POA: Diagnosis present

## 2017-09-19 DIAGNOSIS — N132 Hydronephrosis with renal and ureteral calculous obstruction: Secondary | ICD-10-CM

## 2017-09-19 DIAGNOSIS — N12 Tubulo-interstitial nephritis, not specified as acute or chronic: Secondary | ICD-10-CM

## 2017-09-19 HISTORY — PX: CYSTOSCOPY WITH STENT PLACEMENT: SHX5790

## 2017-09-19 LAB — URINALYSIS, ROUTINE W REFLEX MICROSCOPIC
Bilirubin Urine: NEGATIVE
Glucose, UA: NEGATIVE mg/dL
Ketones, ur: NEGATIVE mg/dL
Nitrite: NEGATIVE
Protein, ur: NEGATIVE mg/dL
Specific Gravity, Urine: 1.016 (ref 1.005–1.030)
WBC, UA: >50 WBC/hpf — ABNORMAL HIGH (ref 0–5)
pH: 5 (ref 5.0–8.0)

## 2017-09-19 LAB — COMPREHENSIVE METABOLIC PANEL
ALT: 12 U/L (ref 0–44)
AST: 21 U/L (ref 15–41)
Albumin: 3.7 g/dL (ref 3.5–5.0)
Alkaline Phosphatase: 52 U/L (ref 38–126)
Anion gap: 11 (ref 5–15)
BUN: 16 mg/dL (ref 6–20)
CO2: 24 mmol/L (ref 22–32)
Calcium: 9.3 mg/dL (ref 8.9–10.3)
Chloride: 105 mmol/L (ref 98–111)
Creatinine, Ser: 1.14 mg/dL — ABNORMAL HIGH (ref 0.44–1.00)
GFR calc Af Amer: >60 mL/min (ref >60)
GFR calc non Af Amer: 60 mL/min — ABNORMAL LOW (ref >60)
Glucose, Bld: 105 mg/dL — ABNORMAL HIGH (ref 70–99)
Potassium: 4 mmol/L (ref 3.5–5.1)
Sodium: 140 mmol/L (ref 135–145)
Total Bilirubin: 0.7 mg/dL (ref 0.3–1.2)
Total Protein: 7 g/dL (ref 6.5–8.1)

## 2017-09-19 LAB — CBC
HCT: 37.1 % (ref 36.0–46.0)
Hemoglobin: 11.8 g/dL — ABNORMAL LOW (ref 12.0–15.0)
MCH: 29.1 pg (ref 26.0–34.0)
MCHC: 31.8 g/dL (ref 30.0–36.0)
MCV: 91.6 fL (ref 78.0–100.0)
Platelets: 299 10*3/uL (ref 150–400)
RBC: 4.05 MIL/uL (ref 3.87–5.11)
RDW: 12 % (ref 11.5–15.5)
WBC: 10 10*3/uL (ref 4.0–10.5)

## 2017-09-19 LAB — GRAM STAIN

## 2017-09-19 LAB — I-STAT BETA HCG BLOOD, ED (MC, WL, AP ONLY): I-stat hCG, quantitative: <5 m[IU]/mL (ref <5)

## 2017-09-19 LAB — HIV ANTIBODY (ROUTINE TESTING W REFLEX): HIV Screen 4th Generation wRfx: NONREACTIVE

## 2017-09-19 LAB — LIPASE, BLOOD: Lipase: 33 U/L (ref 11–51)

## 2017-09-19 LAB — MRSA PCR SCREENING: MRSA by PCR: NEGATIVE

## 2017-09-19 SURGERY — CYSTOSCOPY, WITH STENT INSERTION
Anesthesia: General | Laterality: Left

## 2017-09-19 MED ORDER — ONDANSETRON HCL 4 MG/2ML IJ SOLN
4.0000 mg | Freq: Four times a day (QID) | INTRAMUSCULAR | Status: DC | PRN
  Administered 2017-09-19: 4 mg via INTRAVENOUS
  Filled 2017-09-19: qty 2

## 2017-09-19 MED ORDER — IOHEXOL 300 MG/ML  SOLN
INTRAMUSCULAR | Status: DC | PRN
  Administered 2017-09-19: 10 mL via URETHRAL

## 2017-09-19 MED ORDER — KETOROLAC TROMETHAMINE 15 MG/ML IJ SOLN: 30.0000 mg | Freq: Once | INTRAMUSCULAR | Status: DC

## 2017-09-19 MED ORDER — SENNOSIDES-DOCUSATE SODIUM 8.6-50 MG PO TABS: 1.0000 | ORAL_TABLET | Freq: Every evening | ORAL | Status: DC | PRN

## 2017-09-19 MED ORDER — LIDOCAINE 2% (20 MG/ML) 5 ML SYRINGE
INTRAMUSCULAR | Status: AC
  Filled 2017-09-19: qty 5

## 2017-09-19 MED ORDER — FENTANYL CITRATE (PF) 100 MCG/2ML IJ SOLN
INTRAMUSCULAR | Status: AC
  Filled 2017-09-19: qty 2

## 2017-09-19 MED ORDER — SODIUM CHLORIDE 0.9 % IR SOLN
Status: DC | PRN
  Administered 2017-09-19: 3000 mL via INTRAVESICAL

## 2017-09-19 MED ORDER — SCOPOLAMINE 1 MG/3DAYS TD PT72
MEDICATED_PATCH | TRANSDERMAL | Status: AC
  Administered 2017-09-19: 12:00:00
  Filled 2017-09-19: qty 1

## 2017-09-19 MED ORDER — MUPIROCIN 2 % EX OINT
TOPICAL_OINTMENT | Freq: Two times a day (BID) | CUTANEOUS | Status: DC
  Administered 2017-09-19 – 2017-09-20 (×2): via NASAL
  Filled 2017-09-19: qty 22

## 2017-09-19 MED ORDER — SCOPOLAMINE 1 MG/3DAYS TD PT72
MEDICATED_PATCH | TRANSDERMAL | Status: DC | PRN
  Administered 2017-09-19: 1 via TRANSDERMAL

## 2017-09-19 MED ORDER — KETOROLAC TROMETHAMINE 15 MG/ML IJ SOLN
30.0000 mg | Freq: Four times a day (QID) | INTRAMUSCULAR | Status: DC | PRN
Start: 2017-09-19 — End: 2017-09-20
  Administered 2017-09-19 (×2): 30 mg via INTRAVENOUS
  Filled 2017-09-19 (×2): qty 2

## 2017-09-19 MED ORDER — MIDAZOLAM HCL 5 MG/5ML IJ SOLN
INTRAMUSCULAR | Status: DC | PRN
  Administered 2017-09-19: 2 mg via INTRAVENOUS

## 2017-09-19 MED ORDER — SODIUM CHLORIDE 0.9 % IV SOLN
INTRAVENOUS | Status: DC
  Administered 2017-09-19 – 2017-09-20 (×4): via INTRAVENOUS

## 2017-09-19 MED ORDER — MORPHINE SULFATE (PF) 4 MG/ML IV SOLN
6.0000 mg | Freq: Once | INTRAVENOUS | Status: AC
  Administered 2017-09-19: 6 mg via INTRAVENOUS
  Filled 2017-09-19: qty 2

## 2017-09-19 MED ORDER — PHENAZOPYRIDINE HCL 200 MG PO TABS
200.0000 mg | ORAL_TABLET | Freq: Once | ORAL | Status: DC
  Filled 2017-09-19: qty 1

## 2017-09-19 MED ORDER — ONDANSETRON HCL 4 MG/2ML IJ SOLN
INTRAMUSCULAR | Status: AC
  Filled 2017-09-19: qty 2

## 2017-09-19 MED ORDER — SODIUM CHLORIDE 0.9 % IV SOLN
1.0000 g | Freq: Once | INTRAVENOUS | Status: AC
  Administered 2017-09-19: 1 g via INTRAVENOUS
  Filled 2017-09-19: qty 10

## 2017-09-19 MED ORDER — ONDANSETRON HCL 4 MG PO TABS: 4.0000 mg | ORAL_TABLET | Freq: Four times a day (QID) | ORAL | Status: DC | PRN

## 2017-09-19 MED ORDER — ONDANSETRON HCL 4 MG/2ML IJ SOLN
4.0000 mg | Freq: Once | INTRAMUSCULAR | Status: AC
  Administered 2017-09-19: 4 mg via INTRAVENOUS
  Filled 2017-09-19: qty 2

## 2017-09-19 MED ORDER — SODIUM CHLORIDE 0.9 % IV BOLUS
1000.0000 mL | Freq: Once | INTRAVENOUS | Status: AC
  Administered 2017-09-19: 1000 mL via INTRAVENOUS

## 2017-09-19 MED ORDER — DEXAMETHASONE SODIUM PHOSPHATE 10 MG/ML IJ SOLN
INTRAMUSCULAR | Status: DC | PRN
  Administered 2017-09-19: 10 mg via INTRAVENOUS

## 2017-09-19 MED ORDER — PROPOFOL 10 MG/ML IV BOLUS
INTRAVENOUS | Status: DC | PRN
  Administered 2017-09-19: 150 mg via INTRAVENOUS

## 2017-09-19 MED ORDER — ONDANSETRON HCL 4 MG/2ML IJ SOLN
INTRAMUSCULAR | Status: DC | PRN
  Administered 2017-09-19: 4 mg via INTRAVENOUS

## 2017-09-19 MED ORDER — ACETAMINOPHEN 650 MG RE SUPP: 650.0000 mg | Freq: Four times a day (QID) | RECTAL | Status: DC | PRN

## 2017-09-19 MED ORDER — PROMETHAZINE HCL 25 MG/ML IJ SOLN: 6.2500 mg | INTRAMUSCULAR | Status: DC | PRN

## 2017-09-19 MED ORDER — PROPOFOL 10 MG/ML IV BOLUS
INTRAVENOUS | Status: AC
  Filled 2017-09-19: qty 20

## 2017-09-19 MED ORDER — HYDROCODONE-ACETAMINOPHEN 5-325 MG PO TABS
1.0000 | ORAL_TABLET | ORAL | Status: DC | PRN
  Administered 2017-09-19 (×2): 2 via ORAL
  Filled 2017-09-19 (×2): qty 2

## 2017-09-19 MED ORDER — SUCCINYLCHOLINE CHLORIDE 20 MG/ML IJ SOLN
INTRAMUSCULAR | Status: DC | PRN
  Administered 2017-09-19: 120 mg via INTRAVENOUS

## 2017-09-19 MED ORDER — LIDOCAINE HCL (CARDIAC) PF 50 MG/5ML IV SOSY
PREFILLED_SYRINGE | INTRAVENOUS | Status: DC | PRN
  Administered 2017-09-19: 25 mg via INTRAVENOUS
  Administered 2017-09-19: 100 mg via INTRAVENOUS

## 2017-09-19 MED ORDER — DEXAMETHASONE SODIUM PHOSPHATE 10 MG/ML IJ SOLN
INTRAMUSCULAR | Status: AC
  Filled 2017-09-19: qty 1

## 2017-09-19 MED ORDER — ZOLPIDEM TARTRATE 5 MG PO TABS: 5.0000 mg | ORAL_TABLET | Freq: Every evening | ORAL | Status: DC | PRN

## 2017-09-19 MED ORDER — SODIUM CHLORIDE 0.9 % IV SOLN
1.0000 g | INTRAVENOUS | Status: DC
  Administered 2017-09-19 – 2017-09-20 (×2): 1 g via INTRAVENOUS
  Filled 2017-09-19: qty 1
  Filled 2017-09-19: qty 10

## 2017-09-19 MED ORDER — FENTANYL CITRATE (PF) 100 MCG/2ML IJ SOLN
INTRAMUSCULAR | Status: DC | PRN
  Administered 2017-09-19 (×2): 50 ug via INTRAVENOUS

## 2017-09-19 MED ORDER — BISACODYL 10 MG RE SUPP: 10.0000 mg | Freq: Every day | RECTAL | Status: DC | PRN

## 2017-09-19 MED ORDER — LACTATED RINGERS IV SOLN
INTRAVENOUS | Status: DC
  Administered 2017-09-19: 10:00:00 via INTRAVENOUS

## 2017-09-19 MED ORDER — HYDROMORPHONE HCL 1 MG/ML IJ SOLN: 0.2500 mg | INTRAMUSCULAR | Status: DC | PRN

## 2017-09-19 MED ORDER — MORPHINE SULFATE (PF) 2 MG/ML IV SOLN
1.0000 mg | INTRAVENOUS | Status: DC | PRN
  Filled 2017-09-19: qty 1

## 2017-09-19 MED ORDER — ACETAMINOPHEN 325 MG PO TABS: 650.0000 mg | ORAL_TABLET | Freq: Four times a day (QID) | ORAL | Status: DC | PRN

## 2017-09-19 MED ORDER — MIDAZOLAM HCL 2 MG/2ML IJ SOLN
INTRAMUSCULAR | Status: AC
  Filled 2017-09-19: qty 2

## 2017-09-19 SURGICAL SUPPLY — 11 items
BAG URO CATCHER STRL LF (MISCELLANEOUS) ×2 IMPLANT
CATH INTERMIT  6FR 70CM (CATHETERS) ×2 IMPLANT
CLOTH BEACON ORANGE TIMEOUT ST (SAFETY) ×2 IMPLANT
GLOVE BIOGEL M 8.0 STRL (GLOVE) ×4 IMPLANT
GOWN STRL REUS W/ TWL XL LVL3 (GOWN DISPOSABLE) ×1 IMPLANT
GOWN STRL REUS W/TWL XL LVL3 (GOWN DISPOSABLE) ×4
GUIDEWIRE STR DUAL SENSOR (WIRE) ×2 IMPLANT
MANIFOLD NEPTUNE II (INSTRUMENTS) ×2 IMPLANT
PACK CYSTO (CUSTOM PROCEDURE TRAY) ×2 IMPLANT
STENT POLARIS 5FRX24 (STENTS) ×1 IMPLANT
TUBING CONNECTING 10 (TUBING) ×2 IMPLANT

## 2017-09-19 NOTE — Op Note (Signed)
PATIENT:  Kathryn Crawford  PRE-OPERATIVE DIAGNOSIS: 1.  Obstructing left UPJ stone. 2.  Possible early sepsis  POST-OPERATIVE DIAGNOSIS: Same  PROCEDURE: 1.  Cystoscopy with left retrograde pyelogram including interpretation. 2.  Left double-J stent placement  SURGEON:  Garnett FarmMark C Jessiah Wojnar  INDICATION: Kathryn Poseyja D Reisen is a 37 year old female who developed severe left flank pain and was found to have an 11 mm stone obstructing her left kidney at the UPJ.  A second stone was noted in the lower pole of the same kidney.  Her urine had numerous white cells and she had experienced previous chills and malaise and was felt to have potential early sepsis.  An urgent stent is being placed for source control.  ANESTHESIA:  General  EBL:  Minimal  DRAINS: 5 French, 24 cm Polaris stent (no string)  LOCAL MEDICATIONS USED:  None  SPECIMEN: Urine from left renal pelvis for Gram stain, C&S  Findings: 1.  Diffuse patchy reddened mucosa consistent with probable cystitis. 2.  Cloudy urine from renal pelvis suggesting presence of infection.  Description of procedure: After informed consent the patient was taken to the operating room and placed on the table in a supine position. General anesthesia was then administered. Once fully anesthetized the patient was moved to the dorsal lithotomy position and the genitalia were sterilely prepped and draped in standard fashion. An official timeout was then performed.  The 23 French cystoscope was passed into the bladder and the bladder was fully and systematically inspected.  The right and left ureteral orifice were noted to be of normal configuration and position.  There were patchy diffuse red areas on the mucosa but not consistent with CIS but more consistent with cystitis.  No other abnormalities were noted.  A left retrograde pyelogram was then performed by passing a 6 [place]French open-ended ureteral catheter through the cystoscope and into the left ureteral orifice.   Full-strength Omnipaque contrast was then injected under low pressure up the left ureter which was noted to be entirely normal throughout its length until it reached the ureteropelvic junction where there was some hold up but eventually this passed into the intrarenal collecting system that appeared mildly dilated.  A 0.038 inch floppy tip sensor guidewire was then passed through the open-ended catheter and into the area the renal pelvis.  I advanced the open-ended catheter into the area the renal pelvis and remove the guidewire and noted the urine that returned appeared cloudy.  This was obtained and sent for culture.  I then reinserted the guidewire, remove the open-ended stent and proceeded with stent placement.  A 5 French, 24 cm Polaris stent was then passed over the guidewire into the area the renal pelvis.  As the guidewire was removed good curl was noted in the renal pelvis.  The bladder was then drained, the cystoscope removed and the patient was awakened and taken to the recovery room in stable and satisfactory condition.  She tolerated procedure well with no intraoperative complications.  PLAN OF CARE: Discharge to home after PACU  PATIENT DISPOSITION:  PACU - hemodynamically stable.

## 2017-09-19 NOTE — ED Notes (Signed)
Pt transferred to [facility] via [facility]. 

## 2017-09-19 NOTE — Consult Note (Signed)
Urology Consult  CC: Referring physician: Lahoma Crocker, MD Reason for referral: Obstructing left UPJ stone and early sepsis.  Impression/Assessment: Left renal and ureteral calculi: She has an obstructing stone at the UPJ that measures approximately 11 mm.  A second 13 mm stone was seen in the lower pole of that same kidney.  No right renal calculi are present.  She does not appear toxic but does have a low-grade fever and with her history of chills and urine that has significant pyuria and a few bacteria the concern is for that of a possible early sepsis picture although urine cultures are pending.  Regardless I have discussed with the patient the fact that she continues to have flank pain and due to these other factors it is felt placing a stent to unobstruct her kidney is indicated at this time.  I have discussed this procedure with her in detail and the rationale behind placing a stent only and not proceeding with treatment of her stones at this time.  We will over the risks and complications, the probability of success as well as the need for follow-up and definitive management of her renal calculi.   Plan:  1.  Culture urine. 2.  Urgent left ureteral stent placement. 3.  Broad-spectrum antibiotics.   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  She thought it was just because she had worked too much but she then began to experience some mild lower back pain on the left-hand side.  She again thought she may have caused this at work but she awoke with sudden onset of severe left upper quadrant pain which radiated into the flank and eventually moved to the flank.  It was unrelieved by positional change.  She reports no prior history of stones.  There is no family history of kidney stones.  She has a  history of bacterial vaginitis but does not have a history of recurrent urinary tract infections.  A CT scan was obtained that revealed an obstructing left UPJ stone with urine that had significant white cells and a low-grade fever as well as tachycardia.  History reviewed. No pertinent past medical history. Past Surgical History:  Procedure Laterality Date  . TUBAL LIGATION Bilateral     Medications:  Prior to Admission:  Medications Prior to Admission  Medication Sig Dispense Refill Last Dose  . fluticasone (FLONASE) 50 MCG/ACT nasal spray Place 2 sprays into both nostrils daily for 10 days. (Patient not taking: Reported on 09/19/2017) 16 g 0 Completed Course at Unknown time  . HYDROcodone-acetaminophen (NORCO) 5-325 MG tablet Take 1 tablet by mouth every 6 (six) hours as needed for moderate pain. (Patient not taking: Reported on 09/19/2017) 12 tablet 0 Completed Course at Unknown time    Allergies: No Known Allergies  History reviewed. No pertinent family history.  Social History:  reports that she has never smoked. She has never used smokeless tobacco. She reports that she drinks alcohol. She reports that she does not use drugs.  Review of Systems (10 point): Pertinent items are noted in HPI. A comprehensive review of systems was negative except as noted above.  Physical Exam:  Vital signs in last 24 hours: Temp:  [98.1 F (36.7 C)-100.9 F (38.3 C)] 98.1 F (36.7 C) (08/09 1009) Pulse  Rate:  [78-109] 78 (08/09 1009) Resp:  [14-24] 14 (08/09 1009) BP: (116-136)/(70-91) 129/82 (08/09 1009) SpO2:  [92 %-98 %] 92 % (08/09 1009) Weight:  [88.5 kg] 88.5 kg (08/09 0332) General appearance: alert and appears stated age Head: Normocephalic, without obvious abnormality, atraumatic Eyes: conjunctivae/corneas clear. EOM's intact.  Oropharynx: moist mucous membranes Neck: supple, symmetrical, trachea midline Resp: normal respiratory effort Cardio: regular rate and rhythm Back:  symmetric, no curvature. ROM normal. No CVA tenderness. GI: soft, non-tender; bowel sounds normal; no masses,  no organomegaly Back: Left CVAT Extremities: extremities normal, atraumatic, no cyanosis or edema Skin: Skin color normal. No visible rashes or lesions Neurologic: Grossly normal  Laboratory Data:  Recent Labs    09/19/17 0333  WBC 10.0  HGB 11.8*  HCT 37.1   BMET Recent Labs    09/19/17 0333  NA 140  K 4.0  CL 105  CO2 24  GLUCOSE 105*  BUN 16  CREATININE 1.14*  CALCIUM 9.3   No results for input(s): LABPT, INR in the last 72 hours. No results for input(s): LABURIN in the last 72 hours. Results for orders placed or performed during the hospital encounter of 12/24/12  Culture, Group A Strep     Status: None   Collection Time: 12/24/12  1:00 PM  Result Value Ref Range Status   Specimen Description THROAT  Final   Special Requests NONE  Final   Culture   Final    No Beta Hemolytic Streptococci Isolated Performed at [facility]   Report Status 12/26/2012 FINAL  Final   Creatinine: Recent Labs    09/19/17 0333  CREATININE 1.14*    Imaging: Ct Renal Stone Study  Result Date: 09/19/2017 CLINICAL DATA:  Left flank pain EXAM: CT ABDOMEN AND PELVIS WITHOUT CONTRAST TECHNIQUE: Multidetector CT imaging of the abdomen and pelvis was performed following the standard protocol without IV contrast. COMPARISON:  None. FINDINGS: LOWER CHEST: No basilar pulmonary nodules or pleural effusion. No apical pericardial effusion. Bilateral breast implants. HEPATOBILIARY: Normal hepatic contours and density. No intra- or extrahepatic biliary dilatation. Normal gallbladder. PANCREAS: Normal parenchymal contours without ductal dilatation. No peripancreatic fluid collection. SPLEEN: Normal. ADRENALS/URINARY TRACT: --Adrenal glands: Normal. --Right kidney/ureter: No hydronephrosis, nephroureterolithiasis, perinephric stranding or solid renal mass. --Left kidney/ureter: There is a  large stone at the ureteropelvic junction measuring 11 x 5 mm. A second stone at the renal hilum measures 13 x 6 mm. There is mild hydronephrosis with generalized renal edema. --Urinary bladder: Unremarkable STOMACH/BOWEL: --Stomach/Duodenum: No hiatal hernia or other gastric abnormality. Normal duodenal course. --Small bowel: No dilatation or inflammation. --Colon: No focal abnormality. --Appendix: Normal. VASCULAR/LYMPHATIC: Normal course and caliber of the major abdominal vessels. No abdominal or pelvic lymphadenopathy. REPRODUCTIVE: Normal uterus and ovaries. MUSCULOSKELETAL. No bony spinal canal stenosis or focal osseous abnormality. OTHER: There is extensive intermediate density material throughout the subcutaneous tissue of the buttocks. IMPRESSION: 1. Left obstructive uropathy with 11 x 5 mm stone at the ureteropelvic junction causing mild hydronephrosis and renal edema. Second stone at the renal hilum measures 13 x 6 mm. 2. No right nephrolithiasis. 3. Intermediate density material throughout the subcutaneous tissue of the buttocks. This appearance can be caused by cosmetic buttock augmentation injections. Electronically Signed   By: Deatra RobinsonKevin  Herman M.D.   On: 09/19/2017 06:11       Kathryn Crawford C 09/19/2017, 11:04 AM

## 2017-09-19 NOTE — H&P (Addendum)
History and Physical    KEITHA KOLK ZOX:[id number] DOB: 12/28/1980 DOA: 09/19/2017   PCP: Blair Heys, MD   Patient coming from:  Home    Chief Complaint: Left flan pain, nausea and fever   HPI: Kathryn Crawford is a 37 y.o. female with only history of chronic bacterial vaginosis, presenting to the ED with 2 day history L flank and L lower quadrant pain.  The patient is a Engineer, civil (consulting), and works 3 different shifts in different hospitals.  This was related to more of a musculoskeletal origin, however she began to experience dysuria, for which she sought medical attention.  The patient denies not drinking enough water, she denies any prior history of similar complaints.  She denies any history of hyperlipidemia, or diabetes, or gout.  She denies any history of cystitis.  No recent surgeries.  She does have some nausea, and had one episode of vomiting prior to admission.  She denies any diarrhea.  She denies any shortness of breath, cough, chest pain or palpitations.  She denies any tobacco, alcohol, recreational drug use.  37 year old female presents the emergency department with complaints of left sided abdominal pain which began yesterday and now has radiated into the left flank.  She is now having severe left flank pain with radiation towards the left side of her abdomen.  No prior history of kidney stones.  Reports nausea and vomiting.  Denies diarrhea.  Patient is never had symptoms or pain like this before.  Some urinary frequency without dysuria.  Symptoms are severe in severity.   ED Course:  BP 122/70   Pulse (!) 103   Temp (!) 100.9 F (38.3 C) (Rectal)   Resp (!) 24   Ht 5\' 9"  (1.753 m)   Wt 88.5 kg   SpO2 93%   BMI 28.80 kg/m   Glucose 105 Creatinine 1.14 with normal GFR Hemoglobin 11.8 Urinalysis with moderate leukocytes, WBCs greater than 50, few bacteria. hCG is negative Urine culture pending Lipase pending EKG is normal. CT renal stone studies shows Left kidney/ureter,  large stone at the ureteropelvic junction measuring 11 x 5 mm. A second stone at the renal hilum measures 13 x 6 mm. There is mild hydronephrosis with generalized renal edema. Urinary bladder: Unremarkable Patient received Rocephin 1 g, morphine for pain, Zofran for antiemetic.  She also received generous IV fluid.  Review of Systems:  As per HPI otherwise all other systems reviewed and are negative  History reviewed. No pertinent past medical history.  Past Surgical History:  Procedure Laterality Date  . TUBAL LIGATION Bilateral     Social History Social History   Socioeconomic History  . Marital status: Married    Spouse name: Not on file  . Number of children: Not on file  . Years of education: Not on file  . Highest education level: Not on file  Occupational History  . Not on file  Social Needs  . Financial resource strain: Not on file  . Food insecurity:    Worry: Not on file    Inability: Not on file  . Transportation needs:    Medical: Not on file    Non-medical: Not on file  Tobacco Use  . Smoking status: Never Smoker  . Smokeless tobacco: Never Used  Substance and Sexual Activity  . Alcohol use: Yes  . Drug use: No  . Sexual activity: Not on file  Lifestyle  . Physical activity:    Days per week: Not on file  Minutes per session: Not on file  . Stress: Not on file  Relationships  . Social connections:    Talks on phone: Not on file    Gets together: Not on file    Attends religious service: Not on file    Active member of club or organization: Not on file    Attends meetings of clubs or organizations: Not on file    Relationship status: Not on file  . Intimate partner violence:    Fear of current or ex partner: Not on file    Emotionally abused: Not on file    Physically abused: Not on file    Forced sexual activity: Not on file  Other Topics Concern  . Not on file  Social History Narrative  . Not on file     No Known Allergies  No family  history on file.     Prior to Admission medications   Medication Sig Start Date End Date Taking? Authorizing Provider  fluticasone (FLONASE) 50 MCG/ACT nasal spray Place 2 sprays into both nostrils daily for 10 days. Patient not taking: Reported on 09/19/2017 04/07/17 09/19/25  Benay Pike, NP  HYDROcodone-acetaminophen (NORCO) 5-325 MG tablet Take 1 tablet by mouth every 6 (six) hours as needed for moderate pain. Patient not taking: Reported on 09/19/2017 04/28/16   Elvina Sidle, MD     Physical Exam:  Vitals:   09/19/17 0630 09/19/17 0632 09/19/17 0700 09/19/17 0730  BP: 116/82  129/83 122/70  Pulse: (!) 104  97 (!) 103  Resp: (!) 21  (!) 22 (!) 24  Temp:  (!) 100.9 F (38.3 C)    TempSrc:  Rectal    SpO2: 98%  97% 93%  Weight:      Height:       Constitutional: NAD, very uncomfortable due to the left flank and left lower quadrant pain  eyes: PERRL, lids and conjunctivae normal ENMT: Mucous membranes are moist, without exudate or lesions  Neck: normal, supple, no masses, no thyromegaly Respiratory: clear to auscultation bilaterally, no wheezing, no crackles. Normal respiratory effort  Cardiovascular: Regular rate and rhythm, very soft 1 out of 6 murmur, rubs or gallops. No extremity edema. 2+ pedal pulses. No carotid bruits.  Abdomen: Soft, exquisite tenderness to palpation in the left lower quadrant, with left CVAT.  No hepatosplenomegaly. Bowel sounds positive. Musculoskeletal: no clubbing / cyanosis. Moves all extremities Skin: no jaundice, No lesions.  Several tattoos Neurologic: Sensation intact  Strength equal in all extremities Psychiatric:   Alert and oriented x 3.  Tearful due to pain.    Labs on Admission: I have personally reviewed following labs and imaging studies  CBC: Recent Labs  Lab 09/19/17 0333  WBC 10.0  HGB 11.8*  HCT 37.1  MCV 91.6  PLT 299    Basic Metabolic Panel: Recent Labs  Lab 09/19/17 0333  NA 140  K 4.0  CL 105  CO2 24    GLUCOSE 105*  BUN 16  CREATININE 1.14*  CALCIUM 9.3    GFR: Estimated Creatinine Clearance: 78.5 mL/min (A) (by C-G formula based on SCr of 1.14 mg/dL (H)).  Liver Function Tests: Recent Labs  Lab 09/19/17 0333  AST 21  ALT 12  ALKPHOS 52  BILITOT 0.7  PROT 7.0  ALBUMIN 3.7   Recent Labs  Lab 09/19/17 0333  LIPASE 33   No results for input(s): AMMONIA in the last 168 hours.  Coagulation Profile: No results for input(s): INR, PROTIME in the  last 168 hours.  Cardiac Enzymes: No results for input(s): CKTOTAL, CKMB, CKMBINDEX, TROPONINI in the last 168 hours.  BNP (last 3 results) No results for input(s): PROBNP in the last 8760 hours.  HbA1C: No results for input(s): HGBA1C in the last 72 hours.  CBG: No results for input(s): GLUCAP in the last 168 hours.  Lipid Profile: No results for input(s): CHOL, HDL, LDLCALC, TRIG, CHOLHDL, LDLDIRECT in the last 72 hours.  Thyroid Function Tests: No results for input(s): TSH, T4TOTAL, FREET4, T3FREE, THYROIDAB in the last 72 hours.  Anemia Panel: No results for input(s): VITAMINB12, FOLATE, FERRITIN, TIBC, IRON, RETICCTPCT in the last 72 hours.  Urine analysis:    Component Value Date/Time   COLORURINE YELLOW 09/19/2017 0506   APPEARANCEUR HAZY (A) 09/19/2017 0506   LABSPEC 1.016 09/19/2017 0506   PHURINE 5.0 09/19/2017 0506   GLUCOSEU NEGATIVE 09/19/2017 0506   HGBUR MODERATE (A) 09/19/2017 0506   BILIRUBINUR NEGATIVE 09/19/2017 0506   BILIRUBINUR neg 05/14/2017 0813   KETONESUR NEGATIVE 09/19/2017 0506   PROTEINUR NEGATIVE 09/19/2017 0506   UROBILINOGEN negative (A) 05/14/2017 0813   NITRITE NEGATIVE 09/19/2017 0506   LEUKOCYTESUR MODERATE (A) 09/19/2017 0506    Sepsis Labs: @LABRCNTIP (procalcitonin:4,lacticidven:4) )No results found for this or any previous visit (from the past 240 hour(s)).   Radiological Exams on Admission: Ct Renal Stone Study  Result Date: 09/19/2017 CLINICAL DATA:  Left flank  pain EXAM: CT ABDOMEN AND PELVIS WITHOUT CONTRAST TECHNIQUE: Multidetector CT imaging of the abdomen and pelvis was performed following the standard protocol without IV contrast. COMPARISON:  None. FINDINGS: LOWER CHEST: No basilar pulmonary nodules or pleural effusion. No apical pericardial effusion. Bilateral breast implants. HEPATOBILIARY: Normal hepatic contours and density. No intra- or extrahepatic biliary dilatation. Normal gallbladder. PANCREAS: Normal parenchymal contours without ductal dilatation. No peripancreatic fluid collection. SPLEEN: Normal. ADRENALS/URINARY TRACT: --Adrenal glands: Normal. --Right kidney/ureter: No hydronephrosis, nephroureterolithiasis, perinephric stranding or solid renal mass. --Left kidney/ureter: There is a large stone at the ureteropelvic junction measuring 11 x 5 mm. A second stone at the renal hilum measures 13 x 6 mm. There is mild hydronephrosis with generalized renal edema. --Urinary bladder: Unremarkable STOMACH/BOWEL: --Stomach/Duodenum: No hiatal hernia or other gastric abnormality. Normal duodenal course. --Small bowel: No dilatation or inflammation. --Colon: No focal abnormality. --Appendix: Normal. VASCULAR/LYMPHATIC: Normal course and caliber of the major abdominal vessels. No abdominal or pelvic lymphadenopathy. REPRODUCTIVE: Normal uterus and ovaries. MUSCULOSKELETAL. No bony spinal canal stenosis or focal osseous abnormality. OTHER: There is extensive intermediate density material throughout the subcutaneous tissue of the buttocks. IMPRESSION: 1. Left obstructive uropathy with 11 x 5 mm stone at the ureteropelvic junction causing mild hydronephrosis and renal edema. Second stone at the renal hilum measures 13 x 6 mm. 2. No right nephrolithiasis. 3. Intermediate density material throughout the subcutaneous tissue of the buttocks. This appearance can be caused by cosmetic buttock augmentation injections. Electronically Signed   By: Deatra RobinsonKevin  Herman M.D.   On:  09/19/2017 06:11    EKG: Independently reviewed.  Assessment/Plan Principal Problem:   Hydronephrosis with obstructing calculus  Nephrolithiasis w/ hydronephrosis, with acute L flank and L LQ pain :  Renal US confirms a large stone at the ureteropelvic junction measuring 11 x 5 mm. A second stone at the renal hilum measures 13 x 6 mm. There is mild hydronephrosis with generalized renal edema. Bladder normal  No h/o  Hydro in the past.Patient received Rocephin 1 g, morphine for pain, Zofran for antiemetic.  She also received  generous IV fluid. UCX pending . VSS  WBC 10. Other labs normal .Patient is being admitted to [facility] for OR this afternoon, possible stent placement  Admit to med surg to [facility]  IVF 151ml/h Morphine when necessary IV antiemetics NPO  Follow Urine Culture  COntinue Rocephin IV  CBC in am  Follow-up continued urology recommendations  Acute Kidney Injury likely due to above, Cr 1.14 Lab Results  Component Value Date   CREATININE 1.14 (H) 09/19/2017  IVF BMET in am  Urine culture     DVT prophylaxis:  SCD  Code Status:   Full Code   Family Communication:  Discussed with patient Disposition Plan: Expect patient to be discharged to home after condition improves Consults called:    Urology per EDP  Admission status:  Medsurg IP    Marlowe Kays, PA-C [facility]   Amion text  [phone]   09/19/2017, 7:46 AM

## 2017-09-19 NOTE — Transfer of Care (Signed)
Immediate Anesthesia Transfer of Care Note  Patient: Kathryn Crawford  Procedure(s) Performed: CYSTOSCOPY WITH STENT PLACEMENT (Left )  Patient Location: PACU  Anesthesia Type:General  Level of Consciousness: awake, alert , oriented and patient cooperative  Airway & Oxygen Therapy: Patient Spontanous Breathing and Patient connected to face mask oxygen  Post-op Assessment: Report given to RN, Post -op Vital signs reviewed and stable and Patient moving all extremities X 4  Post vital signs: stable  Last Vitals:  Vitals Value Taken Time  BP    Temp    Pulse    Resp    SpO2      Last Pain:  Vitals:   09/19/17 1230  TempSrc:   PainSc: 0-No pain         Complications: No apparent anesthesia complications

## 2017-09-19 NOTE — Anesthesia Postprocedure Evaluation (Signed)
Anesthesia Post Note  Patient: Kathryn Crawford  Procedure(s) Performed: CYSTOSCOPY WITH STENT PLACEMENT (Left )     Patient location during evaluation: PACU Anesthesia Type: General Level of consciousness: sedated Pain management: pain level controlled Vital Signs Assessment: post-procedure vital signs reviewed and stable Respiratory status: spontaneous breathing and respiratory function stable Cardiovascular status: stable Postop Assessment: no apparent nausea or vomiting Anesthetic complications: no    Last Vitals:  Vitals:   09/19/17 1230 09/19/17 1240  BP: 109/72 121/71  Pulse: 72 73  Resp: 15 16  Temp: 36.9 C 37.2 C  SpO2: 96% 98%    Last Pain:  Vitals:   09/19/17 1230  TempSrc:   PainSc: 0-No pain                 Nychelle Cassata DANIEL

## 2017-09-19 NOTE — Anesthesia Preprocedure Evaluation (Addendum)
Anesthesia Evaluation  Patient identified by MRN, date of birth, ID band Patient awake    Reviewed: Allergy & Precautions, NPO status , Patient's Chart, lab work & pertinent test results  History of Anesthesia Complications Negative for: history of anesthetic complications  Airway Mallampati: II  TM Distance: >3 FB Neck ROM: Full    Dental no notable dental hx. (+) Dental Advisory Given   Pulmonary neg pulmonary ROS,    Pulmonary exam normal        Cardiovascular negative cardio ROS Normal cardiovascular exam     Neuro/Psych negative neurological ROS  negative psych ROS   GI/Hepatic negative GI ROS, Neg liver ROS,   Endo/Other  negative endocrine ROS  Renal/GU Renal disease  negative genitourinary   Musculoskeletal negative musculoskeletal ROS (+)   Abdominal   Peds negative pediatric ROS (+)  Hematology negative hematology ROS (+)   Anesthesia Other Findings   Reproductive/Obstetrics negative OB ROS                             Anesthesia Physical Anesthesia Plan  ASA: II  Anesthesia Plan: General   Post-op Pain Management:    Induction: Intravenous, Rapid sequence and Cricoid pressure planned  PONV Risk Score and Plan: 4 or greater and Ondansetron, Dexamethasone, Scopolamine patch - Pre-op and Treatment may vary due to age or medical condition  Airway Management Planned: LMA and Oral ETT  Additional Equipment:   Intra-op Plan:   Post-operative Plan: Extubation in OR  Informed Consent: I have reviewed the patients History and Physical, chart, labs and discussed the procedure including the risks, benefits and alternatives for the proposed anesthesia with the patient or authorized representative who has indicated his/her understanding and acceptance.   Dental advisory given  Plan Discussed with: CRNA and Anesthesiologist  Anesthesia Plan Comments:        Anesthesia  Quick Evaluation

## 2017-09-19 NOTE — ED Triage Notes (Signed)
Pt woke up w/ upper left quad pain that "goes around to my back.:"  Pt reports nausea and "gagging".  Pt tearful in triage.

## 2017-09-19 NOTE — ED Provider Notes (Signed)
[facility] Provider Note   CSN: [id number] Arrival date & time: 09/19/17  [id number]     History   Chief Complaint Chief Complaint  Patient presents with  . Abdominal Pain  . Back Pain  . Nausea    HPI Kathryn Crawford is a 37 y.o. female.  HPI 37 year old female presents the emergency department with complaints of left sided abdominal pain which began yesterday and now has radiated into the left flank.  She is now having severe left flank pain with radiation towards the left side of her abdomen.  No prior history of kidney stones.  Reports nausea and vomiting.  Denies diarrhea.  Patient is never had symptoms or pain like this before.  Some urinary frequency without dysuria.  Symptoms are severe in severity.   History reviewed. No pertinent past medical history.  There are no active problems to display for this patient.   Past Surgical History:  Procedure Laterality Date  . TUBAL LIGATION Bilateral      OB History   None      Home Medications    Prior to Admission medications   Medication Sig Start Date End Date Taking? Authorizing Provider  fluticasone (FLONASE) 50 MCG/ACT nasal spray Place 2 sprays into both nostrils daily for 10 days. Patient not taking: Reported on 09/19/2017 04/07/17 09/19/25  Benay PikeLeath, Christie Janell, NP  HYDROcodone-acetaminophen (NORCO) 5-325 MG tablet Take 1 tablet by mouth every 6 (six) hours as needed for moderate pain. Patient not taking: Reported on 09/19/2017 04/28/16   Elvina SidleLauenstein, Kurt, MD    Family History No family history on file.  Social History Social History   Tobacco Use  . Smoking status: Never Smoker  . Smokeless tobacco: Never Used  Substance Use Topics  . Alcohol use: Yes  . Drug use: No     Allergies   Patient has no known allergies.   Review of Systems Review of Systems  All other systems reviewed and are negative.    Physical Exam Updated Vital Signs BP 116/82   Pulse (!)  104   Temp (!) 100.9 F (38.3 C) (Rectal)   Resp (!) 21   Ht 5\' 9"  (1.753 m)   Wt 88.5 kg   SpO2 98%   BMI 28.80 kg/m   Physical Exam  Constitutional: She is oriented to person, place, and time. She appears well-developed and well-nourished. No distress.  HENT:  Head: Normocephalic and atraumatic.  Eyes: EOM are normal.  Neck: Normal range of motion.  Cardiovascular: Normal rate, regular rhythm and normal heart sounds.  Pulmonary/Chest: Effort normal and breath sounds normal.  Abdominal: Soft. She exhibits no distension. There is no tenderness.  Genitourinary:  Genitourinary Comments: Left CVA tenderness  Musculoskeletal: Normal range of motion.  Neurological: She is alert and oriented to person, place, and time.  Skin: Skin is warm and dry.  Psychiatric: She has a normal mood and affect. Judgment normal.  Nursing note and vitals reviewed.    ED Treatments / Results  Labs (all labs ordered are listed, but only abnormal results are displayed) Labs Reviewed  COMPREHENSIVE METABOLIC PANEL - Abnormal; Notable for the following components:      Result Value   Glucose, Bld 105 (*)    Creatinine, Ser 1.14 (*)    GFR calc non Af Amer 60 (*)    All other components within normal limits  CBC - Abnormal; Notable for the following components:   Hemoglobin 11.8 (*)  All other components within normal limits  URINALYSIS, ROUTINE W REFLEX MICROSCOPIC - Abnormal; Notable for the following components:   APPearance HAZY (*)    Hgb urine dipstick MODERATE (*)    Leukocytes, UA MODERATE (*)    WBC, UA >50 (*)    Bacteria, UA FEW (*)    All other components within normal limits  URINE CULTURE  LIPASE, BLOOD  I-STAT BETA HCG BLOOD, ED ([facility], [facility], [facility] ONLY)    EKG EKG Interpretation  Date/Time:  Friday September 19 2017 03:36:04 EDT Ventricular Rate:  106 PR Interval:  138 QRS Duration: 72 QT Interval:  326 QTC Calculation: 433 R Axis:   53 Text Interpretation:  Sinus tachycardia  Otherwise normal ECG No old tracing to compare Confirmed by Azalia Bilis ([id number]) on 09/19/2017 5:24:23 AM   Radiology Ct Renal Stone Study  Result Date: 09/19/2017 CLINICAL DATA:  Left flank pain EXAM: CT ABDOMEN AND PELVIS WITHOUT CONTRAST TECHNIQUE: Multidetector CT imaging of the abdomen and pelvis was performed following the standard protocol without IV contrast. COMPARISON:  None. FINDINGS: LOWER CHEST: No basilar pulmonary nodules or pleural effusion. No apical pericardial effusion. Bilateral breast implants. HEPATOBILIARY: Normal hepatic contours and density. No intra- or extrahepatic biliary dilatation. Normal gallbladder. PANCREAS: Normal parenchymal contours without ductal dilatation. No peripancreatic fluid collection. SPLEEN: Normal. ADRENALS/URINARY TRACT: --Adrenal glands: Normal. --Right kidney/ureter: No hydronephrosis, nephroureterolithiasis, perinephric stranding or solid renal mass. --Left kidney/ureter: There is a large stone at the ureteropelvic junction measuring 11 x 5 mm. A second stone at the renal hilum measures 13 x 6 mm. There is mild hydronephrosis with generalized renal edema. --Urinary bladder: Unremarkable STOMACH/BOWEL: --Stomach/Duodenum: No hiatal hernia or other gastric abnormality. Normal duodenal course. --Small bowel: No dilatation or inflammation. --Colon: No focal abnormality. --Appendix: Normal. VASCULAR/LYMPHATIC: Normal course and caliber of the major abdominal vessels. No abdominal or pelvic lymphadenopathy. REPRODUCTIVE: Normal uterus and ovaries. MUSCULOSKELETAL. No bony spinal canal stenosis or focal osseous abnormality. OTHER: There is extensive intermediate density material throughout the subcutaneous tissue of the buttocks. IMPRESSION: 1. Left obstructive uropathy with 11 x 5 mm stone at the ureteropelvic junction causing mild hydronephrosis and renal edema. Second stone at the renal hilum measures 13 x 6 mm. 2. No right nephrolithiasis. 3. Intermediate density  material throughout the subcutaneous tissue of the buttocks. This appearance can be caused by cosmetic buttock augmentation injections. Electronically Signed   By: Deatra Robinson M.D.   On: 09/19/2017 06:11    Procedures Procedures (including critical care time)  Medications Ordered in ED Medications  cefTRIAXone (ROCEPHIN) 1 g in sodium chloride 0.9 % 100 mL IVPB (has no administration in time range)  morphine 4 MG/ML injection 6 mg (6 mg Intravenous Given 09/19/17 0541)  ondansetron (ZOFRAN) injection 4 mg (4 mg Intravenous Given 09/19/17 0541)  sodium chloride 0.9 % bolus 1,000 mL (1,000 mLs Intravenous New Bag/Given 09/19/17 0541)  morphine 4 MG/ML injection 6 mg (6 mg Intravenous Given 09/19/17 0644)     Initial Impression / Assessment and Plan / ED Course  I have reviewed the triage vital signs and the nursing notes.  Pertinent labs & imaging results that were available during my care of the patient were reviewed by me and considered in my medical decision making (see chart for details).     Concern for left ureteral colic. No hx of stones  Infected left ureteral stone with temp of 100.9 in ER. Rocephin now.   6:47 AM Case discussed with Dr Vernie Ammons,  Urology, He requests hospitalist admission to [facility] with planned stent placement later today by urology.   Pt updated with plan. Ongoing pain control now. IV rocephin and urine culture  Final Clinical Impressions(s) / ED Diagnoses   Final diagnoses:  Left ureteral stone  Pyelonephritis    ED Discharge Orders    None       Azalia Bilis, MD 09/19/17 [phone]

## 2017-09-19 NOTE — Anesthesia Procedure Notes (Signed)
Procedure Name: Intubation Date/Time: 09/19/2017 11:13 AM Performed by: Lissa Morales, CRNA Pre-anesthesia Checklist: Patient identified, Emergency Drugs available, Suction available and Patient being monitored Patient Re-evaluated:Patient Re-evaluated prior to induction Oxygen Delivery Method: Circle system utilized Preoxygenation: Pre-oxygenation with 100% oxygen Induction Type: IV induction Ventilation: Mask ventilation without difficulty Laryngoscope Size: Mac and 4 Grade View: Grade I Tube type: Oral Tube size: 7.0 mm Number of attempts: 1 Airway Equipment and Method: Stylet and Oral airway Placement Confirmation: ETT inserted through vocal cords under direct vision,  positive ETCO2 and breath sounds checked- equal and bilateral Secured at: 21 cm Tube secured with: Tape Dental Injury: Teeth and Oropharynx as per pre-operative assessment

## 2017-09-20 ENCOUNTER — Encounter (HOSPITAL_COMMUNITY): Payer: Self-pay | Admitting: Urology

## 2017-09-20 DIAGNOSIS — N12 Tubulo-interstitial nephritis, not specified as acute or chronic: Secondary | ICD-10-CM

## 2017-09-20 DIAGNOSIS — N201 Calculus of ureter: Secondary | ICD-10-CM

## 2017-09-20 DIAGNOSIS — N390 Urinary tract infection, site not specified: Secondary | ICD-10-CM | POA: Diagnosis present

## 2017-09-20 DIAGNOSIS — N132 Hydronephrosis with renal and ureteral calculous obstruction: Secondary | ICD-10-CM

## 2017-09-20 LAB — CBC
HCT: 34.6 % — ABNORMAL LOW (ref 36.0–46.0)
Hemoglobin: 11.3 g/dL — ABNORMAL LOW (ref 12.0–15.0)
MCH: 29.7 pg (ref 26.0–34.0)
MCHC: 32.7 g/dL (ref 30.0–36.0)
MCV: 90.8 fL (ref 78.0–100.0)
Platelets: 244 10*3/uL (ref 150–400)
RBC: 3.81 MIL/uL — ABNORMAL LOW (ref 3.87–5.11)
RDW: 12.2 % (ref 11.5–15.5)
WBC: 10.9 10*3/uL — ABNORMAL HIGH (ref 4.0–10.5)

## 2017-09-20 LAB — BASIC METABOLIC PANEL
Anion gap: 8 (ref 5–15)
BUN: 18 mg/dL (ref 6–20)
CO2: 23 mmol/L (ref 22–32)
Calcium: 8.3 mg/dL — ABNORMAL LOW (ref 8.9–10.3)
Chloride: 108 mmol/L (ref 98–111)
Creatinine, Ser: 0.84 mg/dL (ref 0.44–1.00)
GFR calc Af Amer: >60 mL/min (ref >60)
GFR calc non Af Amer: >60 mL/min (ref >60)
Glucose, Bld: 128 mg/dL — ABNORMAL HIGH (ref 70–99)
Potassium: 4.3 mmol/L (ref 3.5–5.1)
Sodium: 139 mmol/L (ref 135–145)

## 2017-09-20 MED ORDER — PHENAZOPYRIDINE HCL 200 MG PO TABS: 200.0000 mg | ORAL_TABLET | Freq: Three times a day (TID) | ORAL | 1 refills | Status: DC | PRN

## 2017-09-20 MED ORDER — CEPHALEXIN 500 MG PO CAPS: 500.0000 mg | ORAL_CAPSULE | Freq: Four times a day (QID) | ORAL | 0 refills | Status: AC

## 2017-09-20 MED ORDER — HYDROCODONE-ACETAMINOPHEN 5-325 MG PO TABS: 1.0000 | ORAL_TABLET | Freq: Four times a day (QID) | ORAL | 0 refills | Status: DC | PRN

## 2017-09-20 MED ORDER — POLYETHYLENE GLYCOL 3350 17 G PO PACK
17.0000 g | PACK | Freq: Every day | ORAL | Status: DC
  Administered 2017-09-20: 17 g via ORAL
  Filled 2017-09-20: qty 1

## 2017-09-20 MED ORDER — SENNOSIDES-DOCUSATE SODIUM 8.6-50 MG PO TABS
1.0000 | ORAL_TABLET | Freq: Two times a day (BID) | ORAL | Status: DC
  Administered 2017-09-20: 1 via ORAL
  Filled 2017-09-20: qty 1

## 2017-09-20 NOTE — Discharge Instructions (Signed)

## 2017-09-20 NOTE — Progress Notes (Signed)
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  She is found to have left hydronephrosis due to 2 renal stones 111 x 5 mm the other 13 x 6 mm and UTI.  Started on IV Rocephin.   09/20/2017: POD #1 post left ureteral stent placement.  Patient seen and examined at her bedside.  She reports her pain is well controlled on current pain management.  Also reports mild ureteral spasm which is tolerable.  Denies nausea and has no new complaints.  Assessment/Plan: Principal Problem:   Hydronephrosis with obstructing calculus Active Problems:   Urinary tract infection  Left-sided nephrolithiasis complicated by hydronephrosis status post left ureteral stent placement POD #1 Pain medications as needed Bowel regimen when taking opiates Follow-up with urology outpatient  Complicated UTI in the setting of left nephrolithiasis and hydronephrosis Started on Keflex by urology Urine culture in process Gram stain revealed gram-negative rods Complete p.o. antibiotics course    Code Status: Full code  Family Communication: None at bedside  Disposition Plan: Discharged to home and close follow-up with urology.  Urology discharged the patient and will need to closely follow-up on the urine culture for sensitivity results.   Consultants:  Urology  Procedures:  Left ureteral stent placement  Antimicrobials:  Keflex Per urology  DVT prophylaxis: SCDs   Objective: Vitals:   09/19/17 1240 09/19/17 2130 09/20/17 0530 09/20/17 0836  BP: 121/71 120/73 101/64 123/88  Pulse: 73 79 66 77  Resp: 16 18 18 18   Temp: 98.9 F (37.2 C) 97.6 F (36.4 C) 98.7 F (37.1 C) 97.7 F (36.5 C)  TempSrc:      SpO2: 98% 96% 97% 99%  Weight:      Height:        Intake/Output Summary  (Last 24 hours) at 09/20/2017 1127 Last data filed at 09/20/2017 0600 Gross per 24 hour  Intake 3274 ml  Output 0 ml  Net 3274 ml   Filed Weights   09/19/17 0332  Weight: 88.5 kg    Exam:  . General: 37 y.o. year-old female well developed well nourished in no acute distress.  Alert and oriented x3. . Cardiovascular: Regular rate and rhythm with no rubs or gallops.  No thyromegaly or JVD noted.   Marland Kitchen. Respiratory: Clear to auscultation with no wheezes or rales. Good inspiratory effort. . Abdomen: Soft nontender nondistended with normal bowel sounds x4 quadrants. . Musculoskeletal: No lower extremity edema. 2/4 pulses in all 4 extremities. . Skin: No ulcerative lesions noted or rashes, . Psychiatry: Mood is appropriate for condition and setting   Data Reviewed: CBC: Recent Labs  Lab 09/19/17 0333 09/20/17 0438  WBC 10.0 10.9*  HGB 11.8* 11.3*  HCT 37.1 34.6*  MCV 91.6 90.8  PLT 299 244   Basic Metabolic Panel: Recent Labs  Lab 09/19/17 0333 09/20/17 0438  NA 140 139  K 4.0 4.3  CL 105 108  CO2 24 23  GLUCOSE 105* 128*  BUN 16 18  CREATININE 1.14* 0.84  CALCIUM 9.3 8.3*   GFR: Estimated Creatinine Clearance: 106.6 mL/min (by C-G formula based on SCr of 0.84 mg/dL). Liver Function Tests: Recent Labs  Lab 09/19/17 0333  AST 21  ALT 12  ALKPHOS 52  BILITOT 0.7  PROT 7.0  ALBUMIN 3.7   Recent Labs  Lab 09/19/17  1610  LIPASE 33   No results for input(s): AMMONIA in the last 168 hours. Coagulation Profile: No results for input(s): INR, PROTIME in the last 168 hours. Cardiac Enzymes: No results for input(s): CKTOTAL, CKMB, CKMBINDEX, TROPONINI in the last 168 hours. BNP (last 3 results) No results for input(s): PROBNP in the last 8760 hours. HbA1C: No results for input(s): HGBA1C in the last 72 hours. CBG: No results for input(s): GLUCAP in the last 168 hours. Lipid Profile: No results for input(s): CHOL, HDL, LDLCALC, TRIG, CHOLHDL, LDLDIRECT in the  last 72 hours. Thyroid Function Tests: No results for input(s): TSH, T4TOTAL, FREET4, T3FREE, THYROIDAB in the last 72 hours. Anemia Panel: No results for input(s): VITAMINB12, FOLATE, FERRITIN, TIBC, IRON, RETICCTPCT in the last 72 hours. Urine analysis:    Component Value Date/Time   COLORURINE YELLOW 09/19/2017 0506   APPEARANCEUR HAZY (A) 09/19/2017 0506   LABSPEC 1.016 09/19/2017 0506   PHURINE 5.0 09/19/2017 0506   GLUCOSEU NEGATIVE 09/19/2017 0506   HGBUR MODERATE (A) 09/19/2017 0506   BILIRUBINUR NEGATIVE 09/19/2017 0506   BILIRUBINUR neg 05/14/2017 0813   KETONESUR NEGATIVE 09/19/2017 0506   PROTEINUR NEGATIVE 09/19/2017 0506   UROBILINOGEN negative (A) 05/14/2017 0813   NITRITE NEGATIVE 09/19/2017 0506   LEUKOCYTESUR MODERATE (A) 09/19/2017 0506   Sepsis Labs: @LABRCNTIP (procalcitonin:4,lacticidven:4)  ) Recent Results (from the past 240 hour(s))  MRSA PCR Screening     Status: None   Collection Time: 09/19/17 10:05 AM  Result Value Ref Range Status   MRSA by PCR NEGATIVE NEGATIVE Final    Comment:        The GeneXpert MRSA Assay (FDA approved for NASAL specimens only), is one component of a comprehensive MRSA colonization surveillance program. It is not intended to diagnose MRSA infection nor to guide or monitor treatment for MRSA infections. Performed at [facility], [address], [place], [place] [address]   Gram stain     Status: None   Collection Time: 09/19/17 11:36 AM  Result Value Ref Range Status   Specimen Description   Final    Urine, Cysto Performed at [facility], [address] [place], [place] [address]    Special Requests   Final    NONE Performed at [facility], [address], [place], [place] [address]    Gram Stain   Final    WBC PRESENT, PREDOMINANTLY PMN GRAM NEGATIVE RODS Performed at [facility], [address], [place], [place] [address]    Report Status  09/19/2017 FINAL  Final      Studies: Dg C-arm 1-60 Min-no Report  Result Date: 09/19/2017 Fluoroscopy was utilized by the requesting physician.  No radiographic interpretation.    Scheduled Meds: . mupirocin ointment   Nasal BID  . phenazopyridine  200 mg Oral Once  . polyethylene glycol  17 g Oral Daily  . senna-docusate  1 tablet Oral BID    Continuous Infusions: . sodium chloride 125 mL/hr at 09/20/17 0600  . cefTRIAXone (ROCEPHIN)  IV 1 g (09/20/17 0805)     LOS: 1 day     Darlin Drop, MD [facility] Pager [phone]  If 7PM-7AM, please contact night-coverage [email or web address] Password [facility] 09/20/2017, 11:27 AM

## 2017-09-20 NOTE — Discharge Summary (Signed)
Physician Discharge Summary  Patient ID: Kathryn Crawford MRN: [id number] DOB/AGE: 15-Apr-1980 37 y.o.  Admit date: 09/19/2017 Discharge date: 09/20/2017  Admission Diagnoses:  Hydronephrosis with obstructing calculus  Discharge Diagnoses:  Principal Problem:   Hydronephrosis with obstructing calculus Active Problems:   Urinary tract infection   History reviewed. No pertinent past medical history.  Surgeries: Procedure(s): CYSTOSCOPY WITH STENT PLACEMENT on 09/19/2017   Consultants (if any): Treatment Team:  Ihor Gullyttelin, Mark, MD  Discharged Condition: Improved  Hospital Course: Kathryn Crawford is an 37 y.o. female who was admitted 09/19/2017 with a diagnosis of Hydronephrosis with obstructing calculus and went to the operating room on 09/19/2017 and underwent the above named procedures.  She did well post op with no fever and minimal stent irritation.  She will be discharged home and will be set up to return to discuss definitive stone management.   She was given perioperative antibiotics:  Anti-infectives (From admission, onward)   Start     Dose/Rate Route Frequency Ordered Stop   09/20/17 0000  cephALEXin (KEFLEX) 500 MG capsule     500 mg Oral 4 times daily 09/20/17 0746 09/30/17 2359   09/19/17 0800  cefTRIAXone (ROCEPHIN) 1 g in sodium chloride 0.9 % 100 mL IVPB     1 g 200 mL/hr over 30 Minutes Intravenous Every 24 hours 09/19/17 0750     09/19/17 0645  cefTRIAXone (ROCEPHIN) 1 g in sodium chloride 0.9 % 100 mL IVPB     1 g 200 mL/hr over 30 Minutes Intravenous  Once 09/19/17 0636 09/19/17 0718    .  She was given sequential compression devices for DVT prophylaxis.  She benefited maximally from the hospital stay and there were no complications.    Recent vital signs:  Vitals:   09/19/17 2130 09/20/17 0530  BP: 120/73 101/64  Pulse: 79 66  Resp: 18 18  Temp: 97.6 F (36.4 C) 98.7 F (37.1 C)  SpO2: 96% 97%    Recent laboratory studies:  Lab Results  Component  Value Date   HGB 11.3 (L) 09/20/2017   HGB 11.8 (L) 09/19/2017   Lab Results  Component Value Date   WBC 10.9 (H) 09/20/2017   PLT 244 09/20/2017   No results found for: INR Lab Results  Component Value Date   NA 139 09/20/2017   K 4.3 09/20/2017   CL 108 09/20/2017   CO2 23 09/20/2017   BUN 18 09/20/2017   CREATININE 0.84 09/20/2017   GLUCOSE 128 (H) 09/20/2017    Discharge Medications:   Allergies as of 09/20/2017   No Known Allergies     Medication List    TAKE these medications   cephALEXin 500 MG capsule Commonly known as:  KEFLEX Take 1 capsule (500 mg total) by mouth 4 (four) times daily for 10 days.   fluticasone 50 MCG/ACT nasal spray Commonly known as:  FLONASE Place 2 sprays into both nostrils daily for 10 days.   HYDROcodone-acetaminophen 5-325 MG tablet Commonly known as:  NORCO/VICODIN Take 1 tablet by mouth every 6 (six) hours as needed for moderate pain.   phenazopyridine 200 MG tablet Commonly known as:  PYRIDIUM Take 1 tablet (200 mg total) by mouth 3 (three) times daily as needed for pain (burning).       Diagnostic Studies: Dg C-arm 1-60 Min-no Report  Result Date: 09/19/2017 Fluoroscopy was utilized by the requesting physician.  No radiographic interpretation.   Ct Renal Stone Study  Result Date: 09/19/2017 CLINICAL DATA:  Left flank pain EXAM: CT ABDOMEN AND PELVIS WITHOUT CONTRAST TECHNIQUE: Multidetector CT imaging of the abdomen and pelvis was performed following the standard protocol without IV contrast. COMPARISON:  None. FINDINGS: LOWER CHEST: No basilar pulmonary nodules or pleural effusion. No apical pericardial effusion. Bilateral breast implants. HEPATOBILIARY: Normal hepatic contours and density. No intra- or extrahepatic biliary dilatation. Normal gallbladder. PANCREAS: Normal parenchymal contours without ductal dilatation. No peripancreatic fluid collection. SPLEEN: Normal. ADRENALS/URINARY TRACT: --Adrenal glands: Normal. --Right  kidney/ureter: No hydronephrosis, nephroureterolithiasis, perinephric stranding or solid renal mass. --Left kidney/ureter: There is a large stone at the ureteropelvic junction measuring 11 x 5 mm. A second stone at the renal hilum measures 13 x 6 mm. There is mild hydronephrosis with generalized renal edema. --Urinary bladder: Unremarkable STOMACH/BOWEL: --Stomach/Duodenum: No hiatal hernia or other gastric abnormality. Normal duodenal course. --Small bowel: No dilatation or inflammation. --Colon: No focal abnormality. --Appendix: Normal. VASCULAR/LYMPHATIC: Normal course and caliber of the major abdominal vessels. No abdominal or pelvic lymphadenopathy. REPRODUCTIVE: Normal uterus and ovaries. MUSCULOSKELETAL. No bony spinal canal stenosis or focal osseous abnormality. OTHER: There is extensive intermediate density material throughout the subcutaneous tissue of the buttocks. IMPRESSION: 1. Left obstructive uropathy with 11 x 5 mm stone at the ureteropelvic junction causing mild hydronephrosis and renal edema. Second stone at the renal hilum measures 13 x 6 mm. 2. No right nephrolithiasis. 3. Intermediate density material throughout the subcutaneous tissue of the buttocks. This appearance can be caused by cosmetic buttock augmentation injections. Electronically Signed   By: Deatra Robinson M.D.   On: 09/19/2017 06:11    Disposition: Discharge disposition: 01-Home or Self Care       Discharge Instructions    Discontinue IV   Complete by:  As directed       Follow-up Information    Ihor Gully, MD Follow up.   Specialty:  Urology Why:  Please call the office on Monday to arrange f/u with Dr. Vernie Ammons for 1-2 weeks to discuss the next procedure.  Contact information: [address] [place] [address] [phone]            Signed: Bjorn Pippin 09/20/2017, 7:49 AM

## 2017-09-21 LAB — URINE CULTURE
Culture: >=100000 — AB
Culture: 40000 — AB

## 2017-09-24 ENCOUNTER — Other Ambulatory Visit: Payer: Self-pay | Admitting: *Deleted

## 2017-09-24 NOTE — Patient Outreach (Signed)
[facility] Care Management  09/24/2017  Kathryn Crawford Jun 18, 1980 [id number]   Telephone call to patient's home / mobile number, spoke with patient, and HIPAA verified.  Discussed [facility] Care Management [facility] Transition of care follow up, patient voiced understanding, and is in agreement to follow up.  States she was recently hospitalized and is doing better.  Patient states she no longer has [employer] insurance, due to working a per diem status with [employer], and [employer] is her primary insurance.    RNCM advised patient not eligible for program due to ineligible insurance, voices understanding, and has no CM needs at this time.  Case closed due to ineligible plan.    Tannen Vandezande H. Gardiner Barefootooper Kathryn Crawford, Kathryn Crawford, Kathryn Crawford [facility] Care [facility] Telephonic CM Phone: [phone] Fax: [phone]

## 2017-10-03 ENCOUNTER — Ambulatory Visit: Payer: PRIVATE HEALTH INSURANCE

## 2017-10-03 ENCOUNTER — Telehealth: Payer: Self-pay | Admitting: Podiatry

## 2017-10-03 ENCOUNTER — Ambulatory Visit (INDEPENDENT_AMBULATORY_CARE_PROVIDER_SITE_OTHER): Payer: PRIVATE HEALTH INSURANCE

## 2017-10-03 ENCOUNTER — Ambulatory Visit (INDEPENDENT_AMBULATORY_CARE_PROVIDER_SITE_OTHER): Payer: PRIVATE HEALTH INSURANCE | Admitting: Podiatry

## 2017-10-03 ENCOUNTER — Encounter: Payer: Self-pay | Admitting: Podiatry

## 2017-10-03 DIAGNOSIS — M6740 Ganglion, unspecified site: Secondary | ICD-10-CM

## 2017-10-03 DIAGNOSIS — M722 Plantar fascial fibromatosis: Secondary | ICD-10-CM | POA: Diagnosis not present

## 2017-10-03 NOTE — Progress Notes (Signed)
Subjective:    Patient ID: Kathryn Crawford, female    DOB: 1980-05-30, 37 y.o.   MRN: [id number]  HPI  37 year old female presents the office today for concerns of left foot pain as well as a knot on the right arch of the foot.  She states that she is been having pain for about 2 months because of shooting pain.  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  Any recent injury or trauma.  The pain does not wake her up at night.  The cyst on the bottom of the right foot is been present for several years and hurts if pressure is applied.  No treatment.    Review of Systems  All other systems reviewed and are negative.  History reviewed. No pertinent past medical history.  Past Surgical History:  Procedure Laterality Date  . CYSTOSCOPY WITH STENT PLACEMENT Left 09/19/2017   Procedure: CYSTOSCOPY WITH STENT PLACEMENT;  Surgeon: Ihor Gullyttelin, Mark, MD;  Location: [facility];  Service: Urology;  Laterality: Left;  . TUBAL LIGATION Bilateral      Current Outpatient Medications:  .  NON FORMULARY, [facility]  Scar Cream -  Verapamil 10%, Pentoxifylline 5% Apply 1-2 grams to affected area 3-4 times daily Qty. 120 gm 3 refills, Disp: , Rfl:  .  fluticasone (FLONASE) 50 MCG/ACT nasal spray, Place 2 sprays into both nostrils daily for 10 days. (Patient not taking: Reported on 09/19/2017), Disp: 16 g, Rfl: 0 .  HYDROcodone-acetaminophen (NORCO) 5-325 MG tablet, Take 1 tablet by mouth every 6 (six) hours as needed for moderate pain., Disp: 12 tablet, Rfl: 0 .  phenazopyridine (PYRIDIUM) 200 MG tablet, Take 1 tablet (200 mg total) by mouth 3 (three) times daily as needed for pain (burning)., Disp: 15 tablet, Rfl: 1  No Known Allergies      Objective:   Physical Exam  General: AAO x3, NAD  Dermatological: Skin is warm, dry and supple bilateral. Nails x 10 are well manicured; remaining integument appears unremarkable at this time. There are no open sores, no preulcerative  lesions, no rash or signs of infection present.  Vascular: Dorsalis Pedis artery and Posterior Tibial artery pedal pulses are 2/4 bilateral with immedate capillary fill time. Pedal hair growth present. No varicosities and no lower extremity edema present bilateral. There is no pain with calf compression, swelling, warmth, erythema.   Neruologic: Grossly intact via light touch bilateral. Vibratory intact via tuning fork bilateral. Protective threshold with Semmes Wienstein monofilament intact to all pedal sites bilateral.Negative tinel sign.   Musculoskeletal: Tenderness to palpation along the plantar medial tubercle of the calcaneus at the insertion of plantar fascia on the left foot. There is no pain along the course of the plantar fascia within the arch of the foot. Plantar fascia appears to be intact. There is no pain with lateral compression of the calcaneus or pain with vibratory sensation. There is no pain along the course or insertion of the achilles tendon.  For now mobile soft tissue mass area.  No other areas of tenderness to bilateral lower extremities.Muscular strength 5/5 in all groups tested bilateral.  Gait: Unassisted, Nonantalgic.     Assessment & Plan:  37 year old female left foot plantar fasciitis, right foot plantar fibromatosis -Treatment options discussed including all alternatives, risks, and complications -Etiology of symptoms were discussed -X-rays were obtained and reviewed with the patient.  No stress fracture.  No calcifications present.  -Steroid injection  performed in the left.  Stretching, icing exercises daily.  Plantar fascial brace dispensed.  Check orthotic coverage for her.  Continue anti-inflammatories as well. -In regards to the right foot she wishes to hold off on a steroid injection today.  I ordered a compound cream to include verapamil through [employer].   Procedure: Injection Tendon/Ligament Discussed alternatives, risks, complications and verbal consent  was obtained.  Location: Left plantar fascia at the glabrous junction; medial approach. Skin Prep: Alcohol. Injectate: 0.5cc 0.5% marcaine plain, 0.5 cc 2% lidocaine plain and, 1 cc kenalog 10. Disposition: Patient tolerated procedure well. Injection site dressed with a band-aid.  Post-injection care was discussed and return precautions discussed.    Return in about 4 weeks (around 10/31/2017).  Vivi Barrack DPM

## 2017-10-03 NOTE — Telephone Encounter (Signed)
lvm for pt that in order to verify insurance coveage for the orthotics I will need a copy of her insurance card. She could either fax it to me or stop by the office and let them scan it in..I left the front fax machine's number on the message.

## 2017-10-03 NOTE — Telephone Encounter (Signed)
Pt brought card back in to be scanned.

## 2017-10-03 NOTE — Patient Instructions (Signed)
For instructions on how to put on your Plantar Fascial Brace, please visit [email or web address]   Plantar Fasciitis (Heel Spur Syndrome) with Rehab The plantar fascia is a fibrous, ligament-like, soft-tissue structure that spans the bottom of the foot. Plantar fasciitis is a condition that causes pain in the foot due to inflammation of the tissue. SYMPTOMS   Pain and tenderness on the underneath side of the foot.  Pain that worsens with standing or walking. CAUSES  Plantar fasciitis is caused by irritation and injury to the plantar fascia on the underneath side of the foot. Common mechanisms of injury include:  Direct trauma to bottom of the foot.  Damage to a small nerve that runs under the foot where the main fascia attaches to the heel bone.  Stress placed on the plantar fascia due to bone spurs. RISK INCREASES WITH:   Activities that place stress on the plantar fascia (running, jumping, pivoting, or cutting).  Poor strength and flexibility.  Improperly fitted shoes.  Tight calf muscles.  Flat feet.  Failure to warm-up properly before activity.  Obesity. PREVENTION  Warm up and stretch properly before activity.  Allow for adequate recovery between workouts.  Maintain physical fitness:  Strength, flexibility, and endurance.  Cardiovascular fitness.  Maintain a health body weight.  Avoid stress on the plantar fascia.  Wear properly fitted shoes, including arch supports for individuals who have flat feet.  PROGNOSIS  If treated properly, then the symptoms of plantar fasciitis usually resolve without surgery. However, occasionally surgery is necessary.  RELATED COMPLICATIONS   Recurrent symptoms that may result in a chronic condition.  Problems of the lower back that are caused by compensating for the injury, such as limping.  Pain or weakness of the foot during push-off following surgery.  Chronic inflammation, scarring, and partial or complete  fascia tear, occurring more often from repeated injections.  TREATMENT  Treatment initially involves the use of ice and medication to help reduce pain and inflammation. The use of strengthening and stretching exercises may help reduce pain with activity, especially stretches of the Achilles tendon. These exercises may be performed at home or with a therapist. Your caregiver may recommend that you use heel cups of arch supports to help reduce stress on the plantar fascia. Occasionally, corticosteroid injections are given to reduce inflammation. If symptoms persist for greater than 6 months despite non-surgical (conservative), then surgery may be recommended.   MEDICATION   If pain medication is necessary, then nonsteroidal anti-inflammatory medications, such as aspirin and ibuprofen, or other minor pain relievers, such as acetaminophen, are often recommended.  Do not take pain medication within 7 days before surgery.  Prescription pain relievers may be given if deemed necessary by your caregiver. Use only as directed and only as much as you need.  Corticosteroid injections may be given by your caregiver. These injections should be reserved for the most serious cases, because they may only be given a certain number of times.  HEAT AND COLD  Cold treatment (icing) relieves pain and reduces inflammation. Cold treatment should be applied for 10 to 15 minutes every 2 to 3 hours for inflammation and pain and immediately after any activity that aggravates your symptoms. Use ice packs or massage the area with a piece of ice (ice massage).  Heat treatment may be used prior to performing the stretching and strengthening activities prescribed by your caregiver, physical therapist, or athletic trainer. Use a heat pack or soak the injury in warm water.  SEEK IMMEDIATE MEDICAL   CARE IF:  Treatment seems to offer no benefit, or the condition worsens.  Any medications produce adverse side effects.   EXERCISES- RANGE OF MOTION (ROM) AND STRETCHING EXERCISES - Plantar Fasciitis (Heel Spur Syndrome) These exercises may help you when beginning to rehabilitate your injury. Your symptoms may resolve with or without further involvement from your physician, physical therapist or athletic trainer. While completing these exercises, remember:   Restoring tissue flexibility helps normal motion to return to the joints. This allows healthier, less painful movement and activity.  An effective stretch should be held for at least 30 seconds.  A stretch should never be painful. You should only feel a gentle lengthening or release in the stretched tissue.  RANGE OF MOTION - Toe Extension, Flexion  Sit with your right / left leg crossed over your opposite knee.  Grasp your toes and gently pull them back toward the top of your foot. You should feel a stretch on the bottom of your toes and/or foot.  Hold this stretch for 10 seconds.  Now, gently pull your toes toward the bottom of your foot. You should feel a stretch on the top of your toes and or foot.  Hold this stretch for 10 seconds. Repeat  times. Complete this stretch 3 times per day.   RANGE OF MOTION - Ankle Dorsiflexion, Active Assisted  Remove shoes and sit on a chair that is preferably not on a carpeted surface.  Place right / left foot under knee. Extend your opposite leg for support.  Keeping your heel down, slide your right / left foot back toward the chair until you feel a stretch at your ankle or calf. If you do not feel a stretch, slide your bottom forward to the edge of the chair, while still keeping your heel down.  Hold this stretch for 10 seconds. Repeat 3 times. Complete this stretch 2 times per day.   STRETCH  Gastroc, Standing  Place hands on wall.  Extend right / left leg, keeping the front knee somewhat bent.  Slightly point your toes inward on your back foot.  Keeping your right / left heel on the floor and your  knee straight, shift your weight toward the wall, not allowing your back to arch.  You should feel a gentle stretch in the right / left calf. Hold this position for 10 seconds. Repeat 3 times. Complete this stretch 2 times per day.  STRETCH  Soleus, Standing  Place hands on wall.  Extend right / left leg, keeping the other knee somewhat bent.  Slightly point your toes inward on your back foot.  Keep your right / left heel on the floor, bend your back knee, and slightly shift your weight over the back leg so that you feel a gentle stretch deep in your back calf.  Hold this position for 10 seconds. Repeat 3 times. Complete this stretch 2 times per day.  STRETCH  Gastrocsoleus, Standing  Note: This exercise can place a lot of stress on your foot and ankle. Please complete this exercise only if specifically instructed by your caregiver.   Place the ball of your right / left foot on a step, keeping your other foot firmly on the same step.  Hold on to the wall or a rail for balance.  Slowly lift your other foot, allowing your body weight to press your heel down over the edge of the step.  You should feel a stretch in your right / left calf.  Hold this   position for 10 seconds.  Repeat this exercise with a slight bend in your right / left knee. Repeat 3 times. Complete this stretch 2 times per day.   STRENGTHENING EXERCISES - Plantar Fasciitis (Heel Spur Syndrome)  These exercises may help you when beginning to rehabilitate your injury. They may resolve your symptoms with or without further involvement from your physician, physical therapist or athletic trainer. While completing these exercises, remember:   Muscles can gain both the endurance and the strength needed for everyday activities through controlled exercises.  Complete these exercises as instructed by your physician, physical therapist or athletic trainer. Progress the resistance and repetitions only as guided.  STRENGTH -  Towel Curls  Sit in a chair positioned on a non-carpeted surface.  Place your foot on a towel, keeping your heel on the floor.  Pull the towel toward your heel by only curling your toes. Keep your heel on the floor. Repeat 3 times. Complete this exercise 2 times per day.  STRENGTH - Ankle Inversion  Secure one end of a rubber exercise band/tubing to a fixed object (table, pole). Loop the other end around your foot just before your toes.  Place your fists between your knees. This will focus your strengthening at your ankle.  Slowly, pull your big toe up and in, making sure the band/tubing is positioned to resist the entire motion.  Hold this position for 10 seconds.  Have your muscles resist the band/tubing as it slowly pulls your foot back to the starting position. Repeat 3 times. Complete this exercises 2 times per day.  Document Released: 01/28/2005 Document Revised: 04/22/2011 Document Reviewed: 05/12/2008 [employer] Patient Information 2014 [employer]. 

## 2017-10-07 ENCOUNTER — Other Ambulatory Visit: Payer: Self-pay | Admitting: Urology

## 2017-10-09 NOTE — Discharge Instructions (Signed)
Lithotripsy, Care After °This sheet gives you information about how to care for yourself after your procedure. Your health care provider may also give you more specific instructions. If you have problems or questions, contact your health care provider. °What can I expect after the procedure? °After the procedure, it is common to have: °· Some blood in your urine. This should only last for a few days. °· Soreness in your back, sides, or upper abdomen for a few days. °· Blotches or bruises on your back where the pressure wave entered the skin. °· Pain, discomfort, or nausea when pieces (fragments) of the kidney stone move through the tube that carries urine from the kidney to the bladder (ureter). Stone fragments may pass soon after the procedure, but they may continue to pass for up to 4-8 weeks. °? If you have severe pain or nausea, contact your health care provider. This may be caused by a large stone that was not broken up, and this may mean that you need more treatment. °· Some pain or discomfort during urination. °· Some pain or discomfort in the lower abdomen or (in men) at the base of the penis. ° °Follow these instructions at home: °Medicines °· Take over-the-counter and prescription medicines only as told by your health care provider. °· If you were prescribed an antibiotic medicine, take it as told by your health care provider. Do not stop taking the antibiotic even if you start to feel better. °· Do not drive for 24 hours if you were given a medicine to help you relax (sedative). °· Do not drive or use heavy machinery while taking prescription pain medicine. °Eating and drinking °· Drink enough water and fluids to keep your urine clear or pale yellow. This helps any remaining pieces of the stone to pass. It can also help prevent new stones from forming. °· Eat plenty of fresh fruits and vegetables. °· Follow instructions from your health care provider about eating and drinking restrictions. You may be  instructed: °? To reduce how much salt (sodium) you eat or drink. Check ingredients and nutrition facts on packaged foods and beverages. °? To reduce how much meat you eat. °· Eat the recommended amount of calcium for your age and gender. Ask your health care provider how much calcium you should have. °General instructions °· Get plenty of rest. °· Most people can resume normal activities 1-2 days after the procedure. Ask your health care provider what activities are safe for you. °· If directed, strain all urine through the strainer that was provided by your health care provider. °? Keep all fragments for your health care provider to see. Any stones that are found may be sent to a medical lab for examination. The stone may be as small as a grain of salt. °· Keep all follow-up visits as told by your health care provider. This is important. °Contact a health care provider if: °· You have pain that is severe or does not get better with medicine. °· You have nausea that is severe or does not go away. °· You have blood in your urine longer than your health care provider told you to expect. °· You have more blood in your urine. °· You have pain during urination that does not go away. °· You urinate more frequently than usual and this does not go away. °· You develop a rash or any other possible signs of an allergic reaction. °Get help right away if: °· You have severe pain in   your back, sides, or upper abdomen. °· You have severe pain while urinating. °· Your urine is very dark red. °· You have blood in your stool (feces). °· You cannot pass any urine at all. °· You feel a strong urge to urinate after emptying your bladder. °· You have a fever or chills. °· You develop shortness of breath, difficulty breathing, or chest pain. °· You have severe nausea that leads to persistent vomiting. °· You faint. °Summary °· After this procedure, it is common to have some pain, discomfort, or nausea when pieces (fragments) of the  kidney stone move through the tube that carries urine from the kidney to the bladder (ureter). If this pain or nausea is severe, however, you should contact your health care provider. °· Most people can resume normal activities 1-2 days after the procedure. Ask your health care provider what activities are safe for you. °· Drink enough water and fluids to keep your urine clear or pale yellow. This helps any remaining pieces of the stone to pass, and it can help prevent new stones from forming. °· If directed, strain your urine and keep all fragments for your health care provider to see. Fragments or stones may be as small as a grain of salt. °· Get help right away if you have severe pain in your back, sides, or upper abdomen or have severe pain while urinating. °This information is not intended to replace advice given to you by your health care provider. Make sure you discuss any questions you have with your health care provider. °Document Released: 02/17/2007 Document Revised: 12/20/2015 Document Reviewed: 12/20/2015 °[employer] Interactive Patient Education © 2018 [employer] ° °

## 2017-10-14 ENCOUNTER — Encounter (HOSPITAL_COMMUNITY): Payer: Self-pay | Admitting: General Practice

## 2017-10-16 ENCOUNTER — Ambulatory Visit (HOSPITAL_COMMUNITY)
Admission: RE | Admit: 2017-10-16 | Discharge: 2017-10-16 | Disposition: A | Discharge status: Home / Self Care | Payer: PRIVATE HEALTH INSURANCE | Source: Ambulatory Visit | Attending: Urology | Admitting: Urology

## 2017-10-16 ENCOUNTER — Telehealth (HOSPITAL_COMMUNITY): Payer: Self-pay | Admitting: *Deleted

## 2017-10-16 ENCOUNTER — Encounter (HOSPITAL_COMMUNITY)
Admission: RE | Disposition: A | Discharge status: Home / Self Care | Payer: Self-pay | Source: Ambulatory Visit | Attending: Urology

## 2017-10-16 ENCOUNTER — Encounter (HOSPITAL_COMMUNITY): Payer: Self-pay | Admitting: General Practice

## 2017-10-16 ENCOUNTER — Ambulatory Visit (HOSPITAL_COMMUNITY): Payer: PRIVATE HEALTH INSURANCE

## 2017-10-16 DIAGNOSIS — Z79899 Other long term (current) drug therapy: Secondary | ICD-10-CM | POA: Insufficient documentation

## 2017-10-16 DIAGNOSIS — Z9882 Breast implant status: Secondary | ICD-10-CM | POA: Insufficient documentation

## 2017-10-16 DIAGNOSIS — N132 Hydronephrosis with renal and ureteral calculous obstruction: Secondary | ICD-10-CM | POA: Insufficient documentation

## 2017-10-16 DIAGNOSIS — R310 Gross hematuria: Secondary | ICD-10-CM | POA: Insufficient documentation

## 2017-10-16 DIAGNOSIS — N135 Crossing vessel and stricture of ureter without hydronephrosis: Secondary | ICD-10-CM

## 2017-10-16 HISTORY — DX: Personal history of urinary calculi: Z87.442

## 2017-10-16 HISTORY — DX: Anxiety disorder, unspecified: F41.9

## 2017-10-16 HISTORY — PX: EXTRACORPOREAL SHOCK WAVE LITHOTRIPSY: SHX1557

## 2017-10-16 SURGERY — LITHOTRIPSY, ESWL
Anesthesia: LOCAL | Laterality: Left

## 2017-10-16 MED ORDER — CIPROFLOXACIN HCL 500 MG PO TABS
500.0000 mg | ORAL_TABLET | ORAL | Status: AC
  Administered 2017-10-16: 500 mg via ORAL
  Filled 2017-10-16: qty 1

## 2017-10-16 MED ORDER — DIAZEPAM 5 MG PO TABS
10.0000 mg | ORAL_TABLET | ORAL | Status: AC
  Administered 2017-10-16: 10 mg via ORAL
  Filled 2017-10-16: qty 2

## 2017-10-16 MED ORDER — SODIUM CHLORIDE 0.9 % IV SOLN
INTRAVENOUS | Status: DC
  Administered 2017-10-16: 10:00:00 via INTRAVENOUS

## 2017-10-16 MED ORDER — HYDROCODONE-ACETAMINOPHEN 10-325 MG PO TABS: 1.0000 | ORAL_TABLET | ORAL | 0 refills | Status: DC | PRN

## 2017-10-16 MED ORDER — TAMSULOSIN HCL 0.4 MG PO CAPS: 0.4000 mg | ORAL_CAPSULE | ORAL | 0 refills | Status: DC

## 2017-10-16 MED ORDER — DIPHENHYDRAMINE HCL 25 MG PO CAPS
25.0000 mg | ORAL_CAPSULE | ORAL | Status: AC
  Administered 2017-10-16: 25 mg via ORAL
  Filled 2017-10-16: qty 1

## 2017-10-16 NOTE — H&P (Signed)
Impression/Assessment: Left renal and ureteral calculi: She has an obstructing stone at the UPJ that measures approximately 11 mm.  A second 13 mm stone was seen in the lower pole of that same kidney.  No right renal calculi are present.  She does not appear toxic but does have a low-grade fever and with her history of chills and urine that has significant pyuria and a few bacteria the concern is for that of a possible early sepsis picture although urine cultures are pending.  Regardless I have discussed with the patient the fact that she continues to have flank pain and due to these other factors it is felt placing a stent to unobstruct her kidney is indicated at this time.  I have discussed this procedure with her in detail and the rationale behind placing a stent only and not proceeding with treatment of her stones at this time.  We will over the risks and complications, the probability of success as well as the need for follow-up and definitive management of her renal calculi.   Plan:  1.  Culture urine. 2.  Urgent left ureteral stent placement. 3.  Broad-spectrum antibiotics.   History of Present Illness: 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  She thought it was just because she had worked too much but she then began to experience some mild lower back pain on the left-hand side.  She again thought she may have caused this at work but she awoke with sudden onset of severe left upper quadrant pain which radiated into the flank and eventually moved to the flank.  It was unrelieved by positional change.  She reports no prior history of stones.  There is no family history of kidney stones.  She has a history of bacterial vaginitis but does not have a history of recurrent urinary tract infections.  A CT scan was obtained that  revealed an obstructing left UPJ stone with urine that had significant white cells and a low-grade fever as well as tachycardia.  History reviewed. No pertinent past medical history. Past Surgical History: Procedure Laterality Date . TUBAL LIGATION Bilateral    Medications:  Prior to Admission:  Medications Prior to Admission Medication Sig Dispense Refill Last Dose . fluticasone (FLONASE) 50 MCG/ACT nasal spray Place 2 sprays into both nostrils daily for 10 days. (Patient not taking: Reported on 09/19/2017) 16 g 0 Completed Course at Unknown time . HYDROcodone-acetaminophen (NORCO) 5-325 MG tablet Take 1 tablet by mouth every 6 (six) hours as needed for moderate pain. (Patient not taking: Reported on 09/19/2017) 12 tablet 0 Completed Course at Unknown time   Allergies: No Known Allergies  History reviewed. No pertinent family history.  Social History:  reports that she has never smoked. She has never used smokeless tobacco. She reports that she drinks alcohol. She reports that she does not use drugs.  Review of Systems (10 point): Pertinent items are noted in HPI. A comprehensive review of systems was negative except as noted above.  Physical Exam:  Vital signs in last 24 hours: Temp:  [98.1 F (36.7 C)-100.9 F (38.3 C)] 98.1 F (36.7 C) (08/09 1009) Pulse Rate:  [78-109] 78 (08/09 1009) Resp:  [14-24] 14 (08/09 1009) BP: (116-136)/(70-91) 129/82 (08/09 1009) SpO2:  [92 %-98 %] 92 % (08/09 1009) Weight:  [88.5  kg] 88.5 kg (08/09 0332) General appearance: alert and appears stated age Head: Normocephalic, without obvious abnormality, atraumatic Eyes: conjunctivae/corneas clear. EOM's intact.  Oropharynx: moist mucous membranes Neck: supple, symmetrical, trachea midline Resp: normal respiratory effort Cardio: regular rate and rhythm Back: symmetric, no curvature. ROM normal. No CVA tenderness. GI: soft, non-tender; bowel sounds normal; no masses,  no  organomegaly Back: Left CVAT Extremities: extremities normal, atraumatic, no cyanosis or edema Skin: Skin color normal. No visible rashes or lesions Neurologic: Grossly normal  Laboratory Data:  RecentLabs(last2labs) Recent Labs   09/19/17 0333 WBC 10.0 HGB 11.8* HCT 37.1   BMET RecentLabs(last2labs) Recent Labs   09/19/17 0333 NA 140 K 4.0 CL 105 CO2 24 GLUCOSE 105* BUN 16 CREATININE 1.14* CALCIUM 9.3   RecentLabs(last2labs) No results for input(s): LABPT, INR in the last 72 hours.  RecentLabs(last2labs) No results for input(s): LABURIN in the last 72 hours.  Results for orders placed or performed during the hospital encounter of 12/24/12 Culture, Group A Strep     Status: None  Collection Time: 12/24/12  1:00 PM Result Value Ref Range Status  Specimen Description THROAT  Final  Special Requests NONE  Final  Culture   Final   No Beta Hemolytic Streptococci Isolated Performed at [facility]  Report Status 12/26/2012 FINAL  Final  Creatinine: Recent Labs   09/19/17 0333 CREATININE 1.14*   Imaging:  ImagingResults(Last48hours) Ct Renal Stone Study  Result Date: 09/19/2017 CLINICAL DATA:  Left flank pain EXAM: CT ABDOMEN AND PELVIS WITHOUT CONTRAST TECHNIQUE: Multidetector CT imaging of the abdomen and pelvis was performed following the standard protocol without IV contrast. COMPARISON:  None. FINDINGS: LOWER CHEST: No basilar pulmonary nodules or pleural effusion. No apical pericardial effusion. Bilateral breast implants. HEPATOBILIARY: Normal hepatic contours and density. No intra- or extrahepatic biliary dilatation. Normal gallbladder. PANCREAS: Normal parenchymal contours without ductal dilatation. No peripancreatic fluid collection. SPLEEN: Normal. ADRENALS/URINARY TRACT: --Adrenal glands: Normal. --Right kidney/ureter: No hydronephrosis, nephroureterolithiasis, perinephric stranding or solid renal mass.  --Left kidney/ureter: There is a large stone at the ureteropelvic junction measuring 11 x 5 mm. A second stone at the renal hilum measures 13 x 6 mm. There is mild hydronephrosis with generalized renal edema. --Urinary bladder: Unremarkable STOMACH/BOWEL: --Stomach/Duodenum: No hiatal hernia or other gastric abnormality. Normal duodenal course. --Small bowel: No dilatation or inflammation. --Colon: No focal abnormality. --Appendix: Normal. VASCULAR/LYMPHATIC: Normal course and caliber of the major abdominal vessels. No abdominal or pelvic lymphadenopathy. REPRODUCTIVE: Normal uterus and ovaries. MUSCULOSKELETAL. No bony spinal canal stenosis or focal osseous abnormality. OTHER: There is extensive intermediate density material throughout the subcutaneous tissue of the buttocks. IMPRESSION: 1. Left obstructive uropathy with 11 x 5 mm stone at the ureteropelvic junction causing mild hydronephrosis and renal edema. Second stone at the renal hilum measures 13 x 6 mm. 2. No right nephrolithiasis. 3. Intermediate density material throughout the subcutaneous tissue of the buttocks. This appearance can be caused by cosmetic buttock augmentation injections. Electronically Signed   By: Deatra Robinson M.D.   On: 09/19/2017 06:11     10/16/17: Patient was seen in consultation on 8/9 after presenting with severe left flank pain and was found to have an 11 mm stone obstructing her left kidney at the UPJ. A second stone was noted in the lower pole of the left kidney. Her UA has numerous white cells and she had experienced previous chills and malaise. She was felt to have potential early sepsis and underwent left ureteral stent placement. Urine culture  positive for Proteus Mirabilis and she was discharged home on Keflex. Creatinine noted to be 0.84. She returns today for follow up. Overall, she states that she has been doing fairly well. She denies any significant left flank or abdominal pain. Though, she does complain of some  discomfort, at times. She does state that she does have bothersome urgency, frequency, and suprapubic pain. She states that this has been ongoing since her procedure. She notes intermittent gross hematuria, but denies clots. She denies dysuria, fever, chills, nausea, or vomiting. She completed course of Keflex  on 09/29/17. Follow up culture was negative. On KUB, her left ureteral stent appears to remain in good position. Opacity consistent with calculus noted on CT imaging noted at left UPJ, measuring approximatly 11 mm in size. There is also a stable calculus noted within the confines of left renal shadow measuring approximatly 12 mm in size.

## 2017-10-16 NOTE — Op Note (Signed)
See [facility] OP note scanned into chart. Also because of the size, density, location and other factors that cannot be anticipated I feel this will likely be a staged procedure. This fact supersedes any indication in the scanned [facility] operative note to the contrary.  

## 2017-10-30 ENCOUNTER — Ambulatory Visit: Payer: PRIVATE HEALTH INSURANCE | Admitting: Podiatry

## 2017-11-03 ENCOUNTER — Encounter (HOSPITAL_COMMUNITY): Payer: Self-pay | Admitting: Urology

## 2017-11-12 ENCOUNTER — Other Ambulatory Visit (HOSPITAL_COMMUNITY)
Admission: RE | Admit: 2017-11-12 | Discharge: 2017-11-12 | Disposition: A | Discharge status: Home / Self Care | Payer: PRIVATE HEALTH INSURANCE | Source: Other Acute Inpatient Hospital | Attending: Urology | Admitting: Urology

## 2017-11-12 DIAGNOSIS — N200 Calculus of kidney: Secondary | ICD-10-CM | POA: Diagnosis present

## 2017-11-14 LAB — PTH, INTACT AND CALCIUM
Calcium, Total (PTH): 9.4 mg/dL (ref 8.7–10.2)
PTH: 7 pg/mL — ABNORMAL LOW (ref 15–65)

## 2018-02-11 HISTORY — PX: WISDOM TOOTH EXTRACTION: SHX21

## 2018-08-12 DIAGNOSIS — I2699 Other pulmonary embolism without acute cor pulmonale: Secondary | ICD-10-CM

## 2018-08-12 HISTORY — DX: Other pulmonary embolism without acute cor pulmonale: I26.99

## 2018-12-23 ENCOUNTER — Other Ambulatory Visit: Payer: Self-pay | Admitting: Obstetrics & Gynecology

## 2018-12-23 DIAGNOSIS — Z1231 Encounter for screening mammogram for malignant neoplasm of breast: Secondary | ICD-10-CM

## 2019-02-24 ENCOUNTER — Other Ambulatory Visit: Payer: Self-pay

## 2019-02-24 ENCOUNTER — Ambulatory Visit
Admission: RE | Admit: 2019-02-24 | Discharge: 2019-02-24 | Disposition: A | Discharge status: Home / Self Care | Payer: PRIVATE HEALTH INSURANCE | Source: Ambulatory Visit | Attending: Obstetrics & Gynecology | Admitting: Obstetrics & Gynecology

## 2019-02-24 DIAGNOSIS — Z1231 Encounter for screening mammogram for malignant neoplasm of breast: Secondary | ICD-10-CM

## 2019-08-12 DIAGNOSIS — U071 COVID-19: Secondary | ICD-10-CM

## 2019-08-12 HISTORY — DX: COVID-19: U07.1

## 2020-01-05 DIAGNOSIS — R509 Fever, unspecified: Secondary | ICD-10-CM | POA: Diagnosis not present

## 2020-01-08 DIAGNOSIS — Z20822 Contact with and (suspected) exposure to covid-19: Secondary | ICD-10-CM | POA: Diagnosis not present

## 2020-04-28 ENCOUNTER — Ambulatory Visit (HOSPITAL_COMMUNITY)
Admission: RE | Admit: 2020-04-28 | Discharge: 2020-04-28 | Disposition: A | Discharge status: Home / Self Care | Payer: No Typology Code available for payment source | Source: Ambulatory Visit | Attending: Family Medicine | Admitting: Family Medicine

## 2020-04-28 ENCOUNTER — Other Ambulatory Visit (HOSPITAL_COMMUNITY): Payer: Self-pay | Admitting: Family Medicine

## 2020-04-28 ENCOUNTER — Other Ambulatory Visit: Payer: Self-pay

## 2020-04-28 DIAGNOSIS — M79661 Pain in right lower leg: Secondary | ICD-10-CM

## 2020-07-04 DIAGNOSIS — Z20822 Contact with and (suspected) exposure to covid-19: Secondary | ICD-10-CM | POA: Diagnosis not present

## 2020-07-04 DIAGNOSIS — R5383 Other fatigue: Secondary | ICD-10-CM | POA: Diagnosis not present

## 2020-07-04 DIAGNOSIS — R509 Fever, unspecified: Secondary | ICD-10-CM | POA: Diagnosis not present

## 2020-07-04 DIAGNOSIS — R519 Headache, unspecified: Secondary | ICD-10-CM | POA: Diagnosis not present

## 2020-07-05 ENCOUNTER — Observation Stay (HOSPITAL_COMMUNITY)
Admission: EM | Admit: 2020-07-05 | Discharge: 2020-07-06 | Disposition: A | Discharge status: Home / Self Care | Payer: No Typology Code available for payment source | Attending: Family Medicine | Admitting: Family Medicine

## 2020-07-05 DIAGNOSIS — N390 Urinary tract infection, site not specified: Secondary | ICD-10-CM | POA: Diagnosis not present

## 2020-07-05 DIAGNOSIS — Z20822 Contact with and (suspected) exposure to covid-19: Secondary | ICD-10-CM | POA: Diagnosis not present

## 2020-07-05 DIAGNOSIS — R1032 Left lower quadrant pain: Secondary | ICD-10-CM | POA: Diagnosis present

## 2020-07-05 DIAGNOSIS — N138 Other obstructive and reflux uropathy: Secondary | ICD-10-CM

## 2020-07-05 DIAGNOSIS — N200 Calculus of kidney: Secondary | ICD-10-CM

## 2020-07-05 DIAGNOSIS — A419 Sepsis, unspecified organism: Secondary | ICD-10-CM | POA: Diagnosis not present

## 2020-07-05 DIAGNOSIS — N132 Hydronephrosis with renal and ureteral calculous obstruction: Principal | ICD-10-CM | POA: Insufficient documentation

## 2020-07-05 HISTORY — DX: Other pulmonary embolism without acute cor pulmonale: I26.99

## 2020-07-05 MED ORDER — ONDANSETRON 4 MG PO TBDP
4.0000 mg | ORAL_TABLET | Freq: Once | ORAL | Status: AC | PRN
  Administered 2020-07-06: 4 mg via ORAL
  Filled 2020-07-05: qty 1

## 2020-07-06 ENCOUNTER — Observation Stay (HOSPITAL_COMMUNITY): Payer: No Typology Code available for payment source | Admitting: Certified Registered Nurse Anesthetist

## 2020-07-06 ENCOUNTER — Encounter (HOSPITAL_COMMUNITY)
Admission: EM | Disposition: A | Discharge status: Home / Self Care | Payer: Self-pay | Source: Home / Self Care | Attending: Emergency Medicine

## 2020-07-06 ENCOUNTER — Other Ambulatory Visit: Payer: Self-pay

## 2020-07-06 ENCOUNTER — Encounter (HOSPITAL_COMMUNITY): Payer: Self-pay

## 2020-07-06 ENCOUNTER — Observation Stay (HOSPITAL_COMMUNITY): Payer: No Typology Code available for payment source

## 2020-07-06 ENCOUNTER — Emergency Department (HOSPITAL_COMMUNITY): Payer: No Typology Code available for payment source

## 2020-07-06 DIAGNOSIS — A419 Sepsis, unspecified organism: Secondary | ICD-10-CM

## 2020-07-06 DIAGNOSIS — N138 Other obstructive and reflux uropathy: Secondary | ICD-10-CM | POA: Diagnosis not present

## 2020-07-06 DIAGNOSIS — N200 Calculus of kidney: Secondary | ICD-10-CM | POA: Diagnosis not present

## 2020-07-06 DIAGNOSIS — R319 Hematuria, unspecified: Secondary | ICD-10-CM

## 2020-07-06 DIAGNOSIS — N132 Hydronephrosis with renal and ureteral calculous obstruction: Principal | ICD-10-CM

## 2020-07-06 DIAGNOSIS — N390 Urinary tract infection, site not specified: Secondary | ICD-10-CM

## 2020-07-06 DIAGNOSIS — R519 Headache, unspecified: Secondary | ICD-10-CM | POA: Insufficient documentation

## 2020-07-06 HISTORY — PX: CYSTOSCOPY WITH RETROGRADE PYELOGRAM, URETEROSCOPY AND STENT PLACEMENT: SHX5789

## 2020-07-06 LAB — COMPREHENSIVE METABOLIC PANEL
ALT: 31 U/L (ref 0–44)
AST: 40 U/L (ref 15–41)
Albumin: 3.6 g/dL (ref 3.5–5.0)
Alkaline Phosphatase: 75 U/L (ref 38–126)
Anion gap: 9 (ref 5–15)
BUN: 16 mg/dL (ref 6–20)
CO2: 25 mmol/L (ref 22–32)
Calcium: 9.7 mg/dL (ref 8.9–10.3)
Chloride: 103 mmol/L (ref 98–111)
Creatinine, Ser: 1 mg/dL (ref 0.44–1.00)
GFR, Estimated: >60 mL/min (ref >60)
Glucose, Bld: 153 mg/dL — ABNORMAL HIGH (ref 70–99)
Potassium: 3.9 mmol/L (ref 3.5–5.1)
Sodium: 137 mmol/L (ref 135–145)
Total Bilirubin: 0.2 mg/dL — ABNORMAL LOW (ref 0.3–1.2)
Total Protein: 7.4 g/dL (ref 6.5–8.1)

## 2020-07-06 LAB — URINALYSIS, ROUTINE W REFLEX MICROSCOPIC
Bacteria, UA: NONE SEEN
Bilirubin Urine: NEGATIVE
Glucose, UA: NEGATIVE mg/dL
Ketones, ur: NEGATIVE mg/dL
Nitrite: NEGATIVE
Protein, ur: 30 mg/dL — AB
RBC / HPF: >50 RBC/hpf — ABNORMAL HIGH (ref 0–5)
Specific Gravity, Urine: 1.016 (ref 1.005–1.030)
WBC, UA: >50 WBC/hpf — ABNORMAL HIGH (ref 0–5)
pH: 6 (ref 5.0–8.0)

## 2020-07-06 LAB — RESP PANEL BY RT-PCR (FLU A&B, COVID) ARPGX2
Influenza A by PCR: NEGATIVE
Influenza B by PCR: NEGATIVE
SARS Coronavirus 2 by RT PCR: NEGATIVE

## 2020-07-06 LAB — HIV ANTIBODY (ROUTINE TESTING W REFLEX): HIV Screen 4th Generation wRfx: NONREACTIVE

## 2020-07-06 LAB — CBC
HCT: 37 % (ref 36.0–46.0)
Hemoglobin: 12 g/dL (ref 12.0–15.0)
MCH: 29.9 pg (ref 26.0–34.0)
MCHC: 32.4 g/dL (ref 30.0–36.0)
MCV: 92 fL (ref 80.0–100.0)
Platelets: 282 10*3/uL (ref 150–400)
RBC: 4.02 MIL/uL (ref 3.87–5.11)
RDW: 13.2 % (ref 11.5–15.5)
WBC: 8.9 10*3/uL (ref 4.0–10.5)
nRBC: 0 % (ref 0.0–0.2)

## 2020-07-06 LAB — LACTIC ACID, PLASMA: Lactic Acid, Venous: 0.7 mmol/L (ref 0.5–1.9)

## 2020-07-06 LAB — LIPASE, BLOOD: Lipase: 30 U/L (ref 11–51)

## 2020-07-06 LAB — I-STAT BETA HCG BLOOD, ED (MC, WL, AP ONLY): I-stat hCG, quantitative: <5 m[IU]/mL (ref <5)

## 2020-07-06 SURGERY — CYSTOURETEROSCOPY, WITH RETROGRADE PYELOGRAM AND STENT INSERTION
Anesthesia: General | Site: Urethra | Laterality: Left

## 2020-07-06 SURGERY — CYSTOURETEROSCOPY, WITH RETROGRADE PYELOGRAM AND STENT INSERTION
Anesthesia: Choice | Laterality: Left

## 2020-07-06 MED ORDER — PROPOFOL 10 MG/ML IV BOLUS
INTRAVENOUS | Status: DC | PRN
  Administered 2020-07-06: 200 mg via INTRAVENOUS

## 2020-07-06 MED ORDER — SODIUM CHLORIDE 0.9 % IV SOLN: 2.0000 g | INTRAVENOUS | Status: DC

## 2020-07-06 MED ORDER — SODIUM CHLORIDE 0.9 % IR SOLN
Status: DC | PRN
  Administered 2020-07-06: 3000 mL

## 2020-07-06 MED ORDER — FENTANYL CITRATE (PF) 100 MCG/2ML IJ SOLN
INTRAMUSCULAR | Status: AC
  Filled 2020-07-06: qty 2

## 2020-07-06 MED ORDER — FLUCONAZOLE 150 MG PO TABS: 150.0000 mg | ORAL_TABLET | Freq: Once | ORAL | 1 refills | Status: AC

## 2020-07-06 MED ORDER — DEXAMETHASONE SODIUM PHOSPHATE 10 MG/ML IJ SOLN
INTRAMUSCULAR | Status: DC | PRN
  Administered 2020-07-06: 10 mg via INTRAVENOUS

## 2020-07-06 MED ORDER — TAMSULOSIN HCL 0.4 MG PO CAPS: 0.4000 mg | ORAL_CAPSULE | Freq: Every day | ORAL | 1 refills | Status: DC

## 2020-07-06 MED ORDER — SODIUM CHLORIDE 0.9 % IV SOLN
2.0000 g | Freq: Once | INTRAVENOUS | Status: AC
  Administered 2020-07-06: 2 g via INTRAVENOUS
  Filled 2020-07-06: qty 20

## 2020-07-06 MED ORDER — SCOPOLAMINE 1 MG/3DAYS TD PT72
MEDICATED_PATCH | TRANSDERMAL | Status: DC | PRN
  Administered 2020-07-06: 1 via TRANSDERMAL

## 2020-07-06 MED ORDER — MIDAZOLAM HCL 2 MG/2ML IJ SOLN: 0.5000 mg | Freq: Once | INTRAMUSCULAR | Status: DC | PRN

## 2020-07-06 MED ORDER — MEPERIDINE HCL 50 MG/ML IJ SOLN: 6.2500 mg | INTRAMUSCULAR | Status: DC | PRN

## 2020-07-06 MED ORDER — OXYCODONE HCL 5 MG PO TABS: 5.0000 mg | ORAL_TABLET | Freq: Once | ORAL | Status: DC | PRN

## 2020-07-06 MED ORDER — MIDAZOLAM HCL 2 MG/2ML IJ SOLN
INTRAMUSCULAR | Status: AC
  Filled 2020-07-06: qty 2

## 2020-07-06 MED ORDER — HYDROMORPHONE HCL 1 MG/ML IJ SOLN
0.5000 mg | INTRAMUSCULAR | Status: DC | PRN
  Administered 2020-07-06: 0.5 mg via INTRAVENOUS
  Filled 2020-07-06: qty 0.5

## 2020-07-06 MED ORDER — SODIUM CHLORIDE 0.9 % IV SOLN: INTRAVENOUS | Status: DC

## 2020-07-06 MED ORDER — HYDROMORPHONE HCL 1 MG/ML IJ SOLN
0.5000 mg | Freq: Once | INTRAMUSCULAR | Status: AC
  Administered 2020-07-06: 0.5 mg via INTRAVENOUS
  Filled 2020-07-06: qty 1

## 2020-07-06 MED ORDER — PROPOFOL 10 MG/ML IV BOLUS
INTRAVENOUS | Status: AC
  Filled 2020-07-06: qty 20

## 2020-07-06 MED ORDER — DEXAMETHASONE SODIUM PHOSPHATE 10 MG/ML IJ SOLN
INTRAMUSCULAR | Status: AC
  Filled 2020-07-06: qty 1

## 2020-07-06 MED ORDER — FENTANYL CITRATE (PF) 100 MCG/2ML IJ SOLN
INTRAMUSCULAR | Status: DC | PRN
  Administered 2020-07-06 (×2): 50 ug via INTRAVENOUS

## 2020-07-06 MED ORDER — SUCCINYLCHOLINE CHLORIDE 200 MG/10ML IV SOSY
PREFILLED_SYRINGE | INTRAVENOUS | Status: DC | PRN
  Administered 2020-07-06: 160 mg via INTRAVENOUS

## 2020-07-06 MED ORDER — HYDROMORPHONE HCL 1 MG/ML IJ SOLN: 0.2500 mg | INTRAMUSCULAR | Status: DC | PRN

## 2020-07-06 MED ORDER — SCOPOLAMINE 1 MG/3DAYS TD PT72
MEDICATED_PATCH | TRANSDERMAL | Status: AC
  Filled 2020-07-06: qty 1

## 2020-07-06 MED ORDER — AMOXICILLIN-POT CLAVULANATE 875-125 MG PO TABS: 1.0000 | ORAL_TABLET | Freq: Two times a day (BID) | ORAL | 0 refills | Status: AC

## 2020-07-06 MED ORDER — ONDANSETRON 4 MG PO TBDP
4.0000 mg | ORAL_TABLET | Freq: Four times a day (QID) | ORAL | Status: DC | PRN
Start: 2020-07-06 — End: 2020-07-06

## 2020-07-06 MED ORDER — PROMETHAZINE HCL 25 MG/ML IJ SOLN: 6.2500 mg | INTRAMUSCULAR | Status: DC | PRN

## 2020-07-06 MED ORDER — LIDOCAINE 2% (20 MG/ML) 5 ML SYRINGE
INTRAMUSCULAR | Status: AC
  Filled 2020-07-06: qty 5

## 2020-07-06 MED ORDER — ACETAMINOPHEN 325 MG PO TABS: 650.0000 mg | ORAL_TABLET | Freq: Four times a day (QID) | ORAL | Status: DC | PRN

## 2020-07-06 MED ORDER — ACETAMINOPHEN 500 MG PO TABS
1000.0000 mg | ORAL_TABLET | Freq: Three times a day (TID) | ORAL | Status: DC | PRN
  Administered 2020-07-06 (×2): 1000 mg via ORAL
  Filled 2020-07-06 (×3): qty 2

## 2020-07-06 MED ORDER — ONDANSETRON HCL 4 MG/2ML IJ SOLN
INTRAMUSCULAR | Status: DC | PRN
  Administered 2020-07-06: 4 mg via INTRAVENOUS

## 2020-07-06 MED ORDER — LIDOCAINE 2% (20 MG/ML) 5 ML SYRINGE
INTRAMUSCULAR | Status: DC | PRN
  Administered 2020-07-06: 20 mg via INTRAVENOUS

## 2020-07-06 MED ORDER — IOHEXOL 300 MG/ML  SOLN
INTRAMUSCULAR | Status: DC | PRN
  Administered 2020-07-06: 10 mL

## 2020-07-06 MED ORDER — OXYCODONE HCL 5 MG/5ML PO SOLN: 5.0000 mg | Freq: Once | ORAL | Status: DC | PRN

## 2020-07-06 MED ORDER — TAMSULOSIN HCL 0.4 MG PO CAPS
0.4000 mg | ORAL_CAPSULE | Freq: Every day | ORAL | Status: DC
  Administered 2020-07-06: 0.4 mg via ORAL
  Filled 2020-07-06: qty 1

## 2020-07-06 MED ORDER — MIDAZOLAM HCL 2 MG/2ML IJ SOLN
INTRAMUSCULAR | Status: DC | PRN
  Administered 2020-07-06: 2 mg via INTRAVENOUS

## 2020-07-06 MED ORDER — ONDANSETRON HCL 4 MG/2ML IJ SOLN
INTRAMUSCULAR | Status: AC
  Filled 2020-07-06: qty 2

## 2020-07-06 MED ORDER — SUCCINYLCHOLINE CHLORIDE 200 MG/10ML IV SOSY
PREFILLED_SYRINGE | INTRAVENOUS | Status: AC
  Filled 2020-07-06: qty 10

## 2020-07-06 MED ORDER — ACETAMINOPHEN 650 MG RE SUPP: 650.0000 mg | Freq: Four times a day (QID) | RECTAL | Status: DC | PRN

## 2020-07-06 MED ORDER — STERILE WATER FOR IRRIGATION IR SOLN: Status: DC | PRN

## 2020-07-06 MED ORDER — LACTATED RINGERS IV SOLN: INTRAVENOUS | Status: DC | PRN

## 2020-07-06 MED ORDER — SODIUM CHLORIDE 0.9 % IV BOLUS
1000.0000 mL | Freq: Once | INTRAVENOUS | Status: AC
  Administered 2020-07-06: 1000 mL via INTRAVENOUS

## 2020-07-06 MED ORDER — TAMSULOSIN HCL 0.4 MG PO CAPS: 0.4000 mg | ORAL_CAPSULE | ORAL | Status: DC

## 2020-07-06 SURGICAL SUPPLY — 24 items
BAG URO CATCHER STRL LF (MISCELLANEOUS) ×2 IMPLANT
BASKET LASER NITINOL 1.9FR (BASKET) IMPLANT
BASKET ZERO TIP NITINOL 2.4FR (BASKET) IMPLANT
BSKT STON RTRVL 120 1.9FR (BASKET)
BSKT STON RTRVL ZERO TP 2.4FR (BASKET)
CATH INTERMIT  6FR 70CM (CATHETERS) ×2 IMPLANT
CATH URET 5FR 28IN OPEN ENDED (CATHETERS) ×1 IMPLANT
CLOTH BEACON ORANGE TIMEOUT ST (SAFETY) ×2 IMPLANT
EXTRACTOR STONE 1.7FRX115CM (UROLOGICAL SUPPLIES) IMPLANT
GLOVE SURG ENC MOIS LTX SZ7.5 (GLOVE) ×2 IMPLANT
GOWN STRL REUS W/TWL XL LVL3 (GOWN DISPOSABLE) ×2 IMPLANT
GUIDEWIRE ANG ZIPWIRE 038X150 (WIRE) IMPLANT
GUIDEWIRE STR DUAL SENSOR (WIRE) ×2 IMPLANT
KIT TURNOVER KIT A (KITS) ×2 IMPLANT
LASER FIB FLEXIVA PULSE ID 365 (Laser) IMPLANT
MANIFOLD NEPTUNE II (INSTRUMENTS) ×2 IMPLANT
PACK CYSTO (CUSTOM PROCEDURE TRAY) ×2 IMPLANT
SHEATH URETERAL 12FRX28CM (UROLOGICAL SUPPLIES) IMPLANT
SHEATH URETERAL 12FRX35CM (MISCELLANEOUS) IMPLANT
STENT URET 6FRX26 CONTOUR (STENTS) ×1 IMPLANT
TRACTIP FLEXIVA PULS ID 200XHI (Laser) IMPLANT
TRACTIP FLEXIVA PULSE ID 200 (Laser)
TUBING CONNECTING 10 (TUBING) ×2 IMPLANT
TUBING UROLOGY SET (TUBING) ×2 IMPLANT

## 2020-07-06 NOTE — Anesthesia Postprocedure Evaluation (Signed)
Anesthesia Post Note  Patient: Kathryn Crawford  Procedure(s) Performed: CYSTOSCOPY WITH RETROGRADE PYELOGRAM, URETEROSCOPY AND STENT PLACEMENT (Left Urethra)     Patient location during evaluation: PACU Anesthesia Type: General Level of consciousness: awake and alert Pain management: pain level controlled Vital Signs Assessment: post-procedure vital signs reviewed and stable Respiratory status: spontaneous breathing, nonlabored ventilation and respiratory function stable Cardiovascular status: blood pressure returned to baseline and stable Postop Assessment: no apparent nausea or vomiting Anesthetic complications: no   No complications documented.  Last Vitals:  Vitals:   07/06/20 0800 07/06/20 0815  BP: 111/77 108/71  Pulse: 87 78  Resp: 15 15  Temp:  36.9 C  SpO2: 93% 94%    Last Pain:  Vitals:   07/06/20 0815  TempSrc:   PainSc: 0-No pain                 Lucretia Kern

## 2020-07-06 NOTE — Anesthesia Procedure Notes (Signed)
Procedure Name: Intubation Date/Time: 07/06/2020 7:02 AM Performed by: Vanessa [place], CRNA Pre-anesthesia Checklist: Patient identified, Emergency Drugs available, Suction available and Patient being monitored Patient Re-evaluated:Patient Re-evaluated prior to induction Oxygen Delivery Method: Circle system utilized Preoxygenation: Pre-oxygenation with 100% oxygen Induction Type: IV induction, Rapid sequence and Cricoid Pressure applied Laryngoscope Size: 2 and Miller Grade View: Grade I Tube type: Oral Tube size: 7.0 mm Number of attempts: 1 Airway Equipment and Method: Stylet Placement Confirmation: ETT inserted through vocal cords under direct vision,  positive ETCO2 and breath sounds checked- equal and bilateral Secured at: 21 cm Tube secured with: Tape Dental Injury: Teeth and Oropharynx as per pre-operative assessment

## 2020-07-06 NOTE — Transfer of Care (Signed)
Immediate Anesthesia Transfer of Care Note  Patient: Kathryn Crawford  Procedure(s) Performed: CYSTOSCOPY WITH RETROGRADE PYELOGRAM, URETEROSCOPY AND STENT PLACEMENT (Left Urethra)  Patient Location: PACU  Anesthesia Type:General  Level of Consciousness: awake, alert , oriented and patient cooperative  Airway & Oxygen Therapy: Patient Spontanous Breathing and Patient connected to face mask  Post-op Assessment: Report given to RN and Post -op Vital signs reviewed and stable  Post vital signs: Reviewed and stable  Last Vitals:  Vitals Value Taken Time  BP 117/86 07/06/20 0739  Temp    Pulse 91 07/06/20 0739  Resp 19 07/06/20 0739  SpO2 95 % 07/06/20 0739  Vitals shown include unvalidated device data.  Last Pain:  Vitals:   07/06/20 0531  TempSrc:   PainSc: 9       Patients Stated Pain Goal: 2 (07/06/20 0531)  Complications: No complications documented.

## 2020-07-06 NOTE — Discharge Summary (Signed)
Physician Discharge Summary  Kathryn Crawford WFU:[id number] DOB: 1980-06-03 DOA: 07/05/2020  PCP: Blair Heys, MD  Admit date: 07/05/2020 Discharge date: 07/06/2020  Recommendations for Outpatient Follow-up:  1. Follow-up complicated nephrolithiasis.  Follow-up stent removal.  See below.   Follow-up Information    Blair Heys, MD Follow up.   Specialty: Family Medicine Why: as needed Contact information: [address] [place] [address] [phone]        Crista Elliot, MD. Schedule an appointment as soon as possible for a visit in 1 week(s).   Specialty: Urology Contact information: [address] [place] [address] [phone]                Discharge Diagnoses: Principal diagnosis is #1 Principal Problem:   Urinary tract obstruction by kidney stone Active Problems:   Hydronephrosis with obstructing calculus   Urinary tract infection   Sepsis (HCC)   Headache   Discharge Condition: improved Disposition: home  Diet recommendation:  Diet Orders (From admission, onward)    Start     Ordered   07/06/20 1042  Diet regular Room service appropriate? Yes; Fluid consistency: Thin  Diet effective now       Question Answer Comment  Room service appropriate? Yes   Fluid consistency: Thin      07/06/20 1041   07/06/20 0000  Diet general        07/06/20 1353           Filed Weights   07/05/20 2348 07/06/20 0006  Weight: 84.8 kg 84.8 kg    HPI/Hospital Course:   40 year old woman PMH nephrolithiasis, PE presenting with fever and left flank pain.  CT showed bilateral staghorn stones with mild left hydronephrosis secondary to obstructing left ureteral stone.  Admitted for sepsis secondary to complicated UTI, obstructing left ureteral stone.  Seen by urology, underwent left ureteral stent placement 5/26.  Rapidly improved and discharged home in good condition.  Sepsis secondary to complicated UTI, obstructing left  ureteral stone.   -- Patient doing well, hemodynamic stable, no fever, no leukocytosis.  Feels well.  Requested discharge home.  Discussed with Dr. Alvester Morin who concurred with discharge home.  He recommended antibiotics for 10 days and follow-up stent in 1 week.  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  She understands she needs to follow-up in 1 week.  She understands signs and symptoms to return.  Today's assessment: S: CC: f/u nephrolithiasis  Feels much better, wants to go home.  Husband graduating tonight.  Has a job interview this afternoon.  O: Vitals:  Vitals:   07/06/20 1002 07/06/20 1105  BP: 118/75 116/68  Pulse: 70 81  Resp: 18 18  Temp: 97.9 F (36.6 C) 97.7 F (36.5 C)  SpO2: 95% 94%    Constitutional:  . Appears calm and comfortable ENMT:  . grossly normal hearing  Respiratory:  . CTA bilaterally, no w/r/r.  . Respiratory effort normal. Cardiovascular:  . RRR, no m/r/g Psychiatric:  . judgement and insight appear normal . Mental status o Mood, affect appropriate  CBC and CMP on admission were unremarkable.  Discharge Instructions  Discharge Instructions    Activity as tolerated - No restrictions   Complete by: As directed    Diet general   Complete by: As directed    Discharge instructions   Complete by: As directed    Call your physician or  seek immediate medical attention for pain, vomiting, fever, fatigue or worsening of condition. Down the road you will need either ureteroscopy to clear the ureter but ultimately would recommend a PCNL for the larger renal calculus.     Allergies as of 07/06/2020   No Known Allergies     Medication List    TAKE these medications   acetaminophen 500 MG tablet Commonly known as: TYLENOL Take 1,000 mg by mouth every 6 (six) hours as needed for mild pain, fever or headache.   amoxicillin-clavulanate 875-125 MG tablet Commonly known as: Augmentin Take 1  tablet by mouth 2 (two) times daily for 10 days.   aspirin-acetaminophen-caffeine 250-250-65 MG tablet Commonly known as: EXCEDRIN MIGRAINE Take 2 tablets by mouth every 6 (six) hours as needed for headache.   fluconazole 150 MG tablet Commonly known as: Diflucan Take 1 tablet (150 mg total) by mouth once for 1 dose.   NyQuil Severe Cold/Flu 5-6.25-10-325 MG/15ML Liqd Generic drug: Phenyleph-Doxylamine-DM-APAP Take 15 mLs by mouth at bedtime as needed (sleep/congestion).   tamsulosin 0.4 MG Caps capsule Commonly known as: FLOMAX Take 1 capsule (0.4 mg total) by mouth daily. Start taking on: Jul 07, 2020      No Known Allergies  The results of significant diagnostics from this hospitalization (including imaging, microbiology, ancillary and laboratory) are listed below for reference.    Significant Diagnostic Studies: CT ABDOMEN PELVIS WO CONTRAST  Result Date: 07/06/2020 CLINICAL DATA:  Left lower quadrant abdominal pain, back pain EXAM: CT ABDOMEN AND PELVIS WITHOUT CONTRAST TECHNIQUE: Multidetector CT imaging of the abdomen and pelvis was performed following the standard protocol without IV contrast. COMPARISON:  09/19/2017 FINDINGS: Lower chest: Visualized lung bases are clear. The visualized heart and pericardium are unremarkable. Hepatobiliary: No focal liver abnormality is seen. No gallstones, gallbladder wall thickening, or biliary dilatation. Pancreas: Unremarkable Spleen: Unremarkable Adrenals/Urinary Tract: The adrenal glands are unremarkable. The kidneys are normal in size and position. There is significant interval increase in intrarenal stone burden since prior examination. On the left, a staghorn calculus extends into multiple lower pole calices measuring 15 mm x 32 mm. An additional 8 mm calculus is seen within the lower pole. On the right, at least 4 calcifications are present with the dominant calculus measuring 13 mm x 19 mm. There has developed mild left hydronephrosis  secondary to an obstructing 4 x 6 x 8 mm calculus within the proximal right ureter at the level of the transverse process of L4. The bladder is unremarkable. Stomach/Bowel: Mild diverticulosis of the transverse and ascending colon. The stomach, small bowel, and large bowel are otherwise unremarkable. Appendix normal. No free intraperitoneal gas or fluid. Vascular/Lymphatic: No significant vascular findings are present. No enlarged abdominal or pelvic lymph nodes. Reproductive: Uterus and bilateral adnexa are unremarkable. Other: No abdominal wall hernia. There is extensive subcutaneous soft tissue infiltration involving the dependent subcutaneous fat of the flanks and gluteal regions. This appears similar to prior examination and may represent postsurgical change. The rectum is unremarkable. Musculoskeletal: Transitional lumbar anatomy with a partially sacralized L5 vertebral body. No acute bone abnormality. No lytic or blastic bone lesions are seen. IMPRESSION: Mild left hydronephrosis secondary to an obstructing 4 x 6 x 8 mm calculus within the a proximal left ureter. Superimposed moderate bilateral nonobstructing nephrolithiasis, progressive since prior examination. Electronically Signed   By: Helyn Numbers MD   On: 07/06/2020 01:28   DG C-Arm 1-60 Min-No Report  Result Date: 07/06/2020 Fluoroscopy was utilized by the requesting  physician.  No radiographic interpretation.    Microbiology: Recent Results (from the past 240 hour(s))  Resp Panel by RT-PCR (Flu A&B, Covid) Nasopharyngeal Swab     Status: None   Collection Time: 07/06/20  1:40 AM   Specimen: Nasopharyngeal Swab; Nasopharyngeal(NP) swabs in vial transport medium  Result Value Ref Range Status   SARS Coronavirus 2 by RT PCR NEGATIVE NEGATIVE Final    Comment: (NOTE) SARS-CoV-2 target nucleic acids are NOT DETECTED.  The SARS-CoV-2 RNA is generally detectable in upper respiratory specimens during the acute phase of infection. The  lowest concentration of SARS-CoV-2 viral copies this assay can detect is 138 copies/mL. A negative result does not preclude SARS-Cov-2 infection and should not be used as the sole basis for treatment or other patient management decisions. A negative result may occur with  improper specimen collection/handling, submission of specimen other than nasopharyngeal swab, presence of viral mutation(s) within the areas targeted by this assay, and inadequate number of viral copies(<138 copies/mL). A negative result must be combined with clinical observations, patient history, and epidemiological information. The expected result is Negative.  Fact Sheet for Patients:  BloggerCourse.com  Fact Sheet for Healthcare Providers:  SeriousBroker.it  This test is no t yet approved or cleared by the [place] FDA and  has been authorized for detection and/or diagnosis of SARS-CoV-2 by FDA under an Emergency Use Authorization (EUA). This EUA will remain  in effect (meaning this test can be used) for the duration of the COVID-19 declaration under Section 564(b)(1) of the Act, 21 U.S.C.section 360bbb-3(b)(1), unless the authorization is terminated  or revoked sooner.       Influenza A by PCR NEGATIVE NEGATIVE Final   Influenza B by PCR NEGATIVE NEGATIVE Final    Comment: (NOTE) The Xpert Xpress SARS-CoV-2/FLU/RSV plus assay is intended as an aid in the diagnosis of influenza from Nasopharyngeal swab specimens and should not be used as a sole basis for treatment. Nasal washings and aspirates are unacceptable for Xpert Xpress SARS-CoV-2/FLU/RSV testing.  Fact Sheet for Patients: BloggerCourse.com  Fact Sheet for Healthcare Providers: SeriousBroker.it  This test is not yet approved or cleared by the [place] FDA and has been authorized for detection and/or diagnosis of SARS-CoV-2 by FDA under  an Emergency Use Authorization (EUA). This EUA will remain in effect (meaning this test can be used) for the duration of the COVID-19 declaration under Section 564(b)(1) of the Act, 21 U.S.C. section 360bbb-3(b)(1), unless the authorization is terminated or revoked.  Performed at [facility], [address] [place], [place] [address]      Labs: Basic Metabolic Panel: Recent Labs  Lab 07/05/20 2355  NA 137  K 3.9  CL 103  CO2 25  GLUCOSE 153*  BUN 16  CREATININE 1.00  CALCIUM 9.7   Liver Function Tests: Recent Labs  Lab 07/05/20 2355  AST 40  ALT 31  ALKPHOS 75  BILITOT 0.2*  PROT 7.4  ALBUMIN 3.6   Recent Labs  Lab 07/05/20 2355  LIPASE 30   CBC: Recent Labs  Lab 07/05/20 2355  WBC 8.9  HGB 12.0  HCT 37.0  MCV 92.0  PLT 282    Principal Problem:   Urinary tract obstruction by kidney stone Active Problems:   Hydronephrosis with obstructing calculus   Urinary tract infection   Sepsis (HCC)   Headache   Time coordinating discharge: 35 minutes  Signed:  Brendia Sacks, MD  [facility]  07/06/2020, 4:28 PM

## 2020-07-06 NOTE — ED Provider Notes (Signed)
[facility] Emergency Department Provider Note MRN:  [id number]  Arrival date & time: 07/06/20     Chief Complaint   Abdominal Pain   History of Present Illness   Kathryn Crawford is a 40 y.o. year-old female with a history of kidney stones, pulmonary embolism presenting to the ED with chief complaint of abdominal pain.  Patient has been experiencing fever for the past 5 days, up to 105 at home.  Also endorsing headaches, malaise, fatigue.  Today experiencing moderate to severe left lower quadrant abdominal pain.  Feels similar to prior kidney stone in the past.  Urine seems dark and cloudy.  Denies chest pain or shortness of breath, no rash, no cough.  Review of Systems  A complete 10 system review of systems was obtained and all systems are negative except as noted in the HPI and PMH.   Patient's Health History    Past Medical History:  Diagnosis Date  . Anxiety   . History of kidney stones   . Pulmonary embolism [facility])     Past Surgical History:  Procedure Laterality Date  . BREAST ENHANCEMENT SURGERY  2008  . CYSTOSCOPY WITH STENT PLACEMENT Left 09/19/2017   Procedure: CYSTOSCOPY WITH STENT PLACEMENT;  Surgeon: Ihor Gully, MD;  Location: [facility];  Service: Urology;  Laterality: Left;  . EXTRACORPOREAL SHOCK WAVE LITHOTRIPSY Left 10/16/2017   Procedure: LEFT EXTRACORPOREAL SHOCK WAVE LITHOTRIPSY (ESWL);  Surgeon: Ihor Gully, MD;  Location: [facility];  Service: Urology;  Laterality: Left;  . TUBAL LIGATION Bilateral     Family History  Problem Relation Age of Onset  . Breast cancer Maternal Grandmother   . Breast cancer Paternal Grandmother     Social History   Socioeconomic History  . Marital status: Married    Spouse name: Not on file  . Number of children: Not on file  . Years of education: Not on file  . Highest education level: Not on file  Occupational History  . Not on file  Tobacco Use  . Smoking status: Never Smoker  .  Smokeless tobacco: Never Used  Vaping Use  . Vaping Use: Never used  Substance and Sexual Activity  . Alcohol use: Yes  . Drug use: No  . Sexual activity: Not on file  Other Topics Concern  . Not on file  Social History Narrative  . Not on file   Social Determinants of Health   Financial Resource Strain: Not on file  Food Insecurity: Not on file  Transportation Needs: Not on file  Physical Activity: Not on file  Stress: Not on file  Social Connections: Not on file  Intimate Partner Violence: Not on file     Physical Exam   Vitals:   07/06/20 0230 07/06/20 0300  BP: 122/76 127/90  Pulse: 83 81  Resp: 18 17  Temp:    SpO2: 91% 94%    CONSTITUTIONAL: Well-appearing, NAD NEURO:  Alert and oriented x 3, no focal deficits EYES:  eyes equal and reactive ENT/NECK:  no LAD, no JVD CARDIO: Tachycardic rate, well-perfused, normal S1 and S2 PULM:  CTAB no wheezing or rhonchi GI/GU:  normal bowel sounds, non-distended, non-tender MSK/SPINE:  No gross deformities, no edema SKIN:  no rash, atraumatic PSYCH:  Appropriate speech and behavior  *Additional and/or pertinent findings included in MDM below  Diagnostic and Interventional Summary    EKG Interpretation  Date/Time:    Ventricular Rate:    PR Interval:    QRS Duration:  QT Interval:    QTC Calculation:   R Axis:     Text Interpretation:        Labs Reviewed  COMPREHENSIVE METABOLIC PANEL - Abnormal; Notable for the following components:      Result Value   Glucose, Bld 153 (*)    Total Bilirubin 0.2 (*)    All other components within normal limits  URINALYSIS, ROUTINE W REFLEX MICROSCOPIC - Abnormal; Notable for the following components:   APPearance CLOUDY (*)    Hgb urine dipstick MODERATE (*)    Protein, ur 30 (*)    Leukocytes,Ua LARGE (*)    RBC / HPF >50 (*)    WBC, UA >50 (*)    All other components within normal limits  RESP PANEL BY RT-PCR (FLU A&B, COVID) ARPGX2  LIPASE, BLOOD  CBC   I-STAT BETA HCG BLOOD, ED ([facility], [facility], [facility] ONLY)    CT ABDOMEN PELVIS WO CONTRAST  Final Result      Medications  ondansetron (ZOFRAN-ODT) disintegrating tablet 4 mg (4 mg Oral Given 07/06/20 0115)  sodium chloride 0.9 % bolus 1,000 mL (0 mLs Intravenous Stopped 07/06/20 0154)  HYDROmorphone (DILAUDID) injection 0.5 mg (0.5 mg Intravenous Given 07/06/20 0119)  cefTRIAXone (ROCEPHIN) 2 g in sodium chloride 0.9 % 100 mL IVPB (0 g Intravenous Stopped 07/06/20 0202)  HYDROmorphone (DILAUDID) injection 0.5 mg (0.5 mg Intravenous Given 07/06/20 0216)     Procedures  /  Critical Care Procedures  ED Course and Medical Decision Making  I have reviewed the triage vital signs, the nursing notes, and pertinent available records from the EMR.  Listed above are laboratory and imaging tests that I personally ordered, reviewed, and interpreted and then considered in my medical decision making (see below for details).  Initial concern is for infected kidney stone, also considering diverticulitis given the left lower quadrant pain.  Could also be viral such as COVID-19.  Awaiting labs, urinalysis, CT abdomen.     Urinalysis is concerning for infection, CT is revealing a large obstructing stone in the left ureter.  She also has a staghorn calculus.  Discussed case with the urology resident, plan is to post patient for tomorrow evening when she is fully n.p.o. (last meal was at 9:30 PM).  Will admit to hospitalist service.  Has received fluids, antibiotics.  COVID is negative.  Elmer Sow. Pilar Plate, MD [facility] Health Emergency Medicine [facility] [email or web address]  Final Clinical Impressions(s) / ED Diagnoses     ICD-10-CM   1. Kidney stone  N20.0   2. Urinary tract infection without hematuria, site unspecified  N39.0     ED Discharge Orders    None       Discharge Instructions Discussed with and Provided to Patient:   Discharge Instructions   None       Sabas Sous,  MD 07/06/20 [phone]

## 2020-07-06 NOTE — ED Triage Notes (Signed)
Arrived POV. Fever/chills/body aches since Monday, normalized with Tylenol. Flu/covid was negative Tuesday at urgent care. No tylenol since 1430. C/o severe headaches and abdominal pain that radiates to lower back, known 52mm kidney stone in left kidney.

## 2020-07-06 NOTE — H&P (Signed)
History and Physical    Kathryn Crawford OZH:[id number] DOB: 04-27-1980 DOA: 07/05/2020  PCP: Blair Heys, MD Patient coming from: Home  Chief Complaint: Fever, left flank pain  HPI: Kathryn Crawford is a 40 y.o. female with medical history significant of nephrolithiasis, PE, anxiety presented to the ED with complaints of fever and left flank pain.  In the ED, had a low-grade fever and was tachycardic on arrival.  Not hypotensive.  Labs showing WBC 8.9, hemoglobin 12.0, platelet count 282K.  Sodium 137, potassium 3.9, chloride 103, bicarb 25, BUN 16, creatinine 1.0, glucose 153.  No elevation of lipase or LFTs.  Beta-hCG negative.  UA with large amount of leukocytes, greater than 50 RBCs, and greater than 50 WBCs.  Screening COVID and influenza PCR negative.  CT showing bilateral staghorn stones and mild left hydronephrosis secondary to obstructing left ureteral stone. Patient was given Dilaudid, ceftriaxone, Zofran, and 1 L normal saline bolus.  Urology consulted.  Patient reports 4-day history of high-grade fevers, chills, and left lower quadrant abdominal pain.  Denies flank pain or dysuria.  She has had intermittent episodes of nausea but has not vomited.  Also endorsing headaches and fatigue.  She is vaccinated against COVID.  Denies cough, shortness of breath, or chest pain.  Review of Systems:  All systems reviewed and apart from history of presenting illness, are negative.  Past Medical History:  Diagnosis Date  . Anxiety   . History of kidney stones   . Pulmonary embolism [facility])     Past Surgical History:  Procedure Laterality Date  . BREAST ENHANCEMENT SURGERY  2008  . CYSTOSCOPY WITH STENT PLACEMENT Left 09/19/2017   Procedure: CYSTOSCOPY WITH STENT PLACEMENT;  Surgeon: Ihor Gully, MD;  Location[facility];  Service: Urology;  Laterality: Left;  . EXTRACORPOREAL SHOCK WAVE LITHOTRIPSY Left 10/16/2017   Procedure: LEFT EXTRACORPOREAL SHOCK WAVE LITHOTRIPSY (ESWL);   Surgeon: Ihor Gully, MD;  Location[facility];  Service: Urology;  Laterality: Left;  . TUBAL LIGATION Bilateral      reports that she has never smoked. She has never used smokeless tobacco. She reports current alcohol use. She reports that she does not use drugs.  No Known Allergies  Family History  Problem Relation Age of Onset  . Breast cancer Maternal Grandmother   . Breast cancer Paternal Grandmother     Prior to Admission medications   Medication Sig Start Date End Date Taking? Authorizing Provider  HYDROcodone-acetaminophen (NORCO) 10-325 MG tablet Take 1-2 tablets by mouth every 4 (four) hours as needed for moderate pain. Maximum dose per 24 hours - 8 pills 10/16/17   Ihor Gully, MD  HYDROcodone-acetaminophen (NORCO) 5-325 MG tablet Take 1 tablet by mouth every 6 (six) hours as needed for moderate pain. 09/20/17   Bjorn Pippin, MD  ondansetron (ZOFRAN) 4 MG tablet Take 4 mg by mouth as needed for nausea or vomiting.    [provider]  phenazopyridine (PYRIDIUM) 200 MG tablet Take 1 tablet (200 mg total) by mouth 3 (three) times daily as needed for pain (burning). 09/20/17   Bjorn Pippin, MD  tamsulosin (FLOMAX) 0.4 MG CAPS capsule Take 1 capsule (0.4 mg total) by mouth daily after supper. 10/16/17   Ihor Gully, MD    Physical Exam: Vitals:   07/06/20 0230 07/06/20 0300 07/06/20 0330 07/06/20 0453  BP: 122/76 127/90 117/83 125/83  Pulse: 83 81 85 91  Resp: 18 17 16    Temp:    99.1 F (37.3 C)  TempSrc:    Oral  SpO2: 91% 94% 93% 99%  Weight:      Height:        Physical Exam Constitutional:      General: She is not in acute distress. HENT:     Head: Normocephalic and atraumatic.  Eyes:     Extraocular Movements: Extraocular movements intact.  Cardiovascular:     Rate and Rhythm: Normal rate and regular rhythm.     Pulses: Normal pulses.  Pulmonary:     Effort: Pulmonary effort is normal. No respiratory distress.     Breath sounds: Normal breath sounds.  No wheezing or rales.  Abdominal:     General: Bowel sounds are normal. There is no distension.     Palpations: Abdomen is soft.     Tenderness: There is no abdominal tenderness.  Musculoskeletal:        General: No swelling or tenderness.     Cervical back: Normal range of motion and neck supple.  Skin:    General: Skin is warm and dry.  Neurological:     General: No focal deficit present.     Mental Status: She is alert and oriented to person, place, and time.     Labs on Admission: I have personally reviewed following labs and imaging studies  CBC: Recent Labs  Lab 07/05/20 2355  WBC 8.9  HGB 12.0  HCT 37.0  MCV 92.0  PLT 282   Basic Metabolic Panel: Recent Labs  Lab 07/05/20 2355  NA 137  K 3.9  CL 103  CO2 25  GLUCOSE 153*  BUN 16  CREATININE 1.00  CALCIUM 9.7   GFR: Estimated Creatinine Clearance: 86 mL/min (by C-G formula based on SCr of 1 mg/dL). Liver Function Tests: Recent Labs  Lab 07/05/20 2355  AST 40  ALT 31  ALKPHOS 75  BILITOT 0.2*  PROT 7.4  ALBUMIN 3.6   Recent Labs  Lab 07/05/20 2355  LIPASE 30   No results for input(s): AMMONIA in the last 168 hours. Coagulation Profile: No results for input(s): INR, PROTIME in the last 168 hours. Cardiac Enzymes: No results for input(s): CKTOTAL, CKMB, CKMBINDEX, TROPONINI in the last 168 hours. BNP (last 3 results) No results for input(s): PROBNP in the last 8760 hours. HbA1C: No results for input(s): HGBA1C in the last 72 hours. CBG: No results for input(s): GLUCAP in the last 168 hours. Lipid Profile: No results for input(s): CHOL, HDL, LDLCALC, TRIG, CHOLHDL, LDLDIRECT in the last 72 hours. Thyroid Function Tests: No results for input(s): TSH, T4TOTAL, FREET4, T3FREE, THYROIDAB in the last 72 hours. Anemia Panel: No results for input(s): VITAMINB12, FOLATE, FERRITIN, TIBC, IRON, RETICCTPCT in the last 72 hours. Urine analysis:    Component Value Date/Time   COLORURINE YELLOW  07/06/2020 0040   APPEARANCEUR CLOUDY (A) 07/06/2020 0040   LABSPEC 1.016 07/06/2020 0040   PHURINE 6.0 07/06/2020 0040   GLUCOSEU NEGATIVE 07/06/2020 0040   HGBUR MODERATE (A) 07/06/2020 0040   BILIRUBINUR NEGATIVE 07/06/2020 0040   BILIRUBINUR neg 05/14/2017 0813   KETONESUR NEGATIVE 07/06/2020 0040   PROTEINUR 30 (A) 07/06/2020 0040   UROBILINOGEN negative (A) 05/14/2017 0813   NITRITE NEGATIVE 07/06/2020 0040   LEUKOCYTESUR LARGE (A) 07/06/2020 0040    Radiological Exams on Admission: CT ABDOMEN PELVIS WO CONTRAST  Result Date: 07/06/2020 CLINICAL DATA:  Left lower quadrant abdominal pain, back pain EXAM: CT ABDOMEN AND PELVIS WITHOUT CONTRAST TECHNIQUE: Multidetector CT imaging of the abdomen and pelvis was performed  following the standard protocol without IV contrast. COMPARISON:  09/19/2017 FINDINGS: Lower chest: Visualized lung bases are clear. The visualized heart and pericardium are unremarkable. Hepatobiliary: No focal liver abnormality is seen. No gallstones, gallbladder wall thickening, or biliary dilatation. Pancreas: Unremarkable Spleen: Unremarkable Adrenals/Urinary Tract: The adrenal glands are unremarkable. The kidneys are normal in size and position. There is significant interval increase in intrarenal stone burden since prior examination. On the left, a staghorn calculus extends into multiple lower pole calices measuring 15 mm x 32 mm. An additional 8 mm calculus is seen within the lower pole. On the right, at least 4 calcifications are present with the dominant calculus measuring 13 mm x 19 mm. There has developed mild left hydronephrosis secondary to an obstructing 4 x 6 x 8 mm calculus within the proximal right ureter at the level of the transverse process of L4. The bladder is unremarkable. Stomach/Bowel: Mild diverticulosis of the transverse and ascending colon. The stomach, small bowel, and large bowel are otherwise unremarkable. Appendix normal. No free intraperitoneal  gas or fluid. Vascular/Lymphatic: No significant vascular findings are present. No enlarged abdominal or pelvic lymph nodes. Reproductive: Uterus and bilateral adnexa are unremarkable. Other: No abdominal wall hernia. There is extensive subcutaneous soft tissue infiltration involving the dependent subcutaneous fat of the flanks and gluteal regions. This appears similar to prior examination and may represent postsurgical change. The rectum is unremarkable. Musculoskeletal: Transitional lumbar anatomy with a partially sacralized L5 vertebral body. No acute bone abnormality. No lytic or blastic bone lesions are seen. IMPRESSION: Mild left hydronephrosis secondary to an obstructing 4 x 6 x 8 mm calculus within the a proximal left ureter. Superimposed moderate bilateral nonobstructing nephrolithiasis, progressive since prior examination. Electronically Signed   By: Helyn Numbers MD   On: 07/06/2020 01:28    Assessment/Plan Principal Problem:   Urinary tract obstruction by kidney stone Active Problems:   Hydronephrosis with obstructing calculus   Urinary tract infection   Sepsis (HCC)   Headache   Sepsis secondary to complicated UTI/obstructing left ureteral stone CT showing bilateral staghorn stones and mild left hydronephrosis secondary to obstructing left ureteral stone.  UA with signs of infection.  Patient had a low-grade fever on arrival along with tachycardia.  No hypotension.  No leukocytosis on labs.  Tachycardia has resolved after IV fluid bolus.  -Urology consulted and planning on taking patient to the OR for left ureteral stent placement in the morning.  Recommending keeping the patient n.p.o., Flomax, hydration, continuing antibiotic. Order blood and urine cultures.  Check lactic acid level.  Pain management.  Headaches No focal neurodeficit. -Symptomatic management  DVT prophylaxis: SCDs Code Status: Full code Family Communication: No family at bedside. Disposition Plan: Status is:  Observation  The patient remains OBS appropriate and will d/c before 2 midnights.  Dispo: The patient is from: Home              Anticipated d/c is to: Home              Patient currently is not medically stable to d/c.   Difficult to place patient No   Level of care: Level of care: Med-Surg   The medical decision making on this patient was of high complexity and the patient is at high risk for clinical deterioration, therefore this is a level 3 visit.  John Giovanni MD [facility]  If 7PM-7AM, please contact night-coverage [email or web address]  07/06/2020, 5:20 AM

## 2020-07-06 NOTE — H&P (Signed)
Urology Consult   Physician requesting consult: Kennis CarinaMichael Bero   Reason for consult: obstructing ureteral stone  History of Present Illness: Kathryn Crawford is a 40 y.o. with history of nephrolithiasis requiring interventions in the past who presents with several days of left flank pain, fever at home found to have obstructing L ureteral stone along with bilateral staghorn stones. UA concerning for infection. No leukocytosis. Cr wnl. She is HDS with low grade temperature, pain controlled with IV pain medication. She has received hydration and empiric antibiotics. She denies recent hematuria, dysuria, changes in bowel or bladder habits, nausea or vomiting.   Past Medical History:  Diagnosis Date  . Anxiety   . History of kidney stones   . Pulmonary embolism [facility](HCC)     Past Surgical History:  Procedure Laterality Date  . BREAST ENHANCEMENT SURGERY  2008  . CYSTOSCOPY WITH STENT PLACEMENT Left 09/19/2017   Procedure: CYSTOSCOPY WITH STENT PLACEMENT;  Surgeon: Ihor Gullyttelin, Mark, MD;  Location: [facility];  Service: Urology;  Laterality: Left;  . EXTRACORPOREAL SHOCK WAVE LITHOTRIPSY Left 10/16/2017   Procedure: LEFT EXTRACORPOREAL SHOCK WAVE LITHOTRIPSY (ESWL);  Surgeon: Ihor Gullyttelin, Mark, MD;  Location: [facility];  Service: Urology;  Laterality: Left;  . TUBAL LIGATION Bilateral      Current Hospital Medications:  Home meds:  No current facility-administered medications on file prior to encounter.   Current Outpatient Medications on File Prior to Encounter  Medication Sig Dispense Refill  . HYDROcodone-acetaminophen (NORCO) 10-325 MG tablet Take 1-2 tablets by mouth every 4 (four) hours as needed for moderate pain. Maximum dose per 24 hours - 8 pills 20 tablet 0  . HYDROcodone-acetaminophen (NORCO) 5-325 MG tablet Take 1 tablet by mouth every 6 (six) hours as needed for moderate pain. 12 tablet 0  . ondansetron (ZOFRAN) 4 MG tablet Take 4 mg by mouth as needed for nausea or vomiting.    .  phenazopyridine (PYRIDIUM) 200 MG tablet Take 1 tablet (200 mg total) by mouth 3 (three) times daily as needed for pain (burning). 15 tablet 1  . tamsulosin (FLOMAX) 0.4 MG CAPS capsule Take 1 capsule (0.4 mg total) by mouth daily after supper. 30 capsule 0     Scheduled Meds: Continuous Infusions: PRN Meds:.  Allergies: No Known Allergies  Family History  Problem Relation Age of Onset  . Breast cancer Maternal Grandmother   . Breast cancer Paternal Grandmother     Social History:  reports that she has never smoked. She has never used smokeless tobacco. She reports current alcohol use. She reports that she does not use drugs.  ROS: A complete review of systems was performed.  All systems are negative except for pertinent findings as noted.  Physical Exam:  Vital signs in last 24 hours: Temp:  [100 F (37.8 C)] 100 F (37.8 C) (05/25 2348) Pulse Rate:  [81-115] 81 (05/26 0300) Resp:  [17-20] 17 (05/26 0300) BP: (122-128)/(76-95) 127/90 (05/26 0300) SpO2:  [91 %-99 %] 94 % (05/26 0300) Weight:  [84.8 kg] 84.8 kg (05/26 0006) Constitutional:  Alert and oriented, No acute distress Cardiovascular: Regular rate and rhythm, No JVD Respiratory: Normal respiratory effort, Lungs clear bilaterally GI: Abdomen is soft, nontender, nondistended, no abdominal masses GU: mild left CVA tenderness Lymphatic: No lymphadenopathy Neurologic: Grossly intact, no focal deficits Psychiatric: Normal mood and affect  Laboratory Data:  Recent Labs    07/05/20 2355  WBC 8.9  HGB 12.0  HCT 37.0  PLT 282    Recent Labs  07/05/20 2355  NA 137  K 3.9  CL 103  GLUCOSE 153*  BUN 16  CALCIUM 9.7  CREATININE 1.00     Results for orders placed or performed during the hospital encounter of 07/05/20 (from the past 24 hour(s))  Lipase, blood     Status: None   Collection Time: 07/05/20 11:55 PM  Result Value Ref Range   Lipase 30 11 - 51 U/L  Comprehensive metabolic panel     Status:  Abnormal   Collection Time: 07/05/20 11:55 PM  Result Value Ref Range   Sodium 137 135 - 145 mmol/L   Potassium 3.9 3.5 - 5.1 mmol/L   Chloride 103 98 - 111 mmol/L   CO2 25 22 - 32 mmol/L   Glucose, Bld 153 (H) 70 - 99 mg/dL   BUN 16 6 - 20 mg/dL   Creatinine, Ser 4.17 0.44 - 1.00 mg/dL   Calcium 9.7 8.9 - 40.8 mg/dL   Total Protein 7.4 6.5 - 8.1 g/dL   Albumin 3.6 3.5 - 5.0 g/dL   AST 40 15 - 41 U/L   ALT 31 0 - 44 U/L   Alkaline Phosphatase 75 38 - 126 U/L   Total Bilirubin 0.2 (L) 0.3 - 1.2 mg/dL   GFR, Estimated >14 >48 mL/min   Anion gap 9 5 - 15  CBC     Status: None   Collection Time: 07/05/20 11:55 PM  Result Value Ref Range   WBC 8.9 4.0 - 10.5 K/uL   RBC 4.02 3.87 - 5.11 MIL/uL   Hemoglobin 12.0 12.0 - 15.0 g/dL   HCT 18.5 63.1 - 49.7 %   MCV 92.0 80.0 - 100.0 fL   MCH 29.9 26.0 - 34.0 pg   MCHC 32.4 30.0 - 36.0 g/dL   RDW 02.6 37.8 - 58.8 %   Platelets 282 150 - 400 K/uL   nRBC 0.0 0.0 - 0.2 %  I-Stat beta hCG blood, ED     Status: None   Collection Time: 07/06/20 12:04 AM  Result Value Ref Range   I-stat hCG, quantitative <5.0 <5 mIU/mL   Comment 3          Urinalysis, Routine w reflex microscopic     Status: Abnormal   Collection Time: 07/06/20 12:40 AM  Result Value Ref Range   Color, Urine YELLOW YELLOW   APPearance CLOUDY (A) CLEAR   Specific Gravity, Urine 1.016 1.005 - 1.030   pH 6.0 5.0 - 8.0   Glucose, UA NEGATIVE NEGATIVE mg/dL   Hgb urine dipstick MODERATE (A) NEGATIVE   Bilirubin Urine NEGATIVE NEGATIVE   Ketones, ur NEGATIVE NEGATIVE mg/dL   Protein, ur 30 (A) NEGATIVE mg/dL   Nitrite NEGATIVE NEGATIVE   Leukocytes,Ua LARGE (A) NEGATIVE   RBC / HPF >50 (H) 0 - 5 RBC/hpf   WBC, UA >50 (H) 0 - 5 WBC/hpf   Bacteria, UA NONE SEEN NONE SEEN   Squamous Epithelial / LPF 0-5 0 - 5   WBC Clumps PRESENT    Mucus PRESENT   Resp Panel by RT-PCR (Flu A&B, Covid) Nasopharyngeal Swab     Status: None   Collection Time: 07/06/20  1:40 AM   Specimen:  Nasopharyngeal Swab; Nasopharyngeal(NP) swabs in vial transport medium  Result Value Ref Range   SARS Coronavirus 2 by RT PCR NEGATIVE NEGATIVE   Influenza A by PCR NEGATIVE NEGATIVE   Influenza B by PCR NEGATIVE NEGATIVE   Recent Results (from the past 240 hour(s))  Resp Panel by RT-PCR (Flu A&B, Covid) Nasopharyngeal Swab     Status: None   Collection Time: 07/06/20  1:40 AM   Specimen: Nasopharyngeal Swab; Nasopharyngeal(NP) swabs in vial transport medium  Result Value Ref Range Status   SARS Coronavirus 2 by RT PCR NEGATIVE NEGATIVE Final    Comment: (NOTE) SARS-CoV-2 target nucleic acids are NOT DETECTED.  The SARS-CoV-2 RNA is generally detectable in upper respiratory specimens during the acute phase of infection. The lowest concentration of SARS-CoV-2 viral copies this assay can detect is 138 copies/mL. A negative result does not preclude SARS-Cov-2 infection and should not be used as the sole basis for treatment or other patient management decisions. A negative result may occur with  improper specimen collection/handling, submission of specimen other than nasopharyngeal swab, presence of viral mutation(s) within the areas targeted by this assay, and inadequate number of viral copies(<138 copies/mL). A negative result must be combined with clinical observations, patient history, and epidemiological information. The expected result is Negative.  Fact Sheet for Patients:  BloggerCourse.com  Fact Sheet for Healthcare Providers:  SeriousBroker.it  This test is no t yet approved or cleared by the [place] FDA and  has been authorized for detection and/or diagnosis of SARS-CoV-2 by FDA under an Emergency Use Authorization (EUA). This EUA will remain  in effect (meaning this test can be used) for the duration of the COVID-19 declaration under Section 564(b)(1) of the Act, 21 U.S.C.section 360bbb-3(b)(1), unless the  authorization is terminated  or revoked sooner.       Influenza A by PCR NEGATIVE NEGATIVE Final   Influenza B by PCR NEGATIVE NEGATIVE Final    Comment: (NOTE) The Xpert Xpress SARS-CoV-2/FLU/RSV plus assay is intended as an aid in the diagnosis of influenza from Nasopharyngeal swab specimens and should not be used as a sole basis for treatment. Nasal washings and aspirates are unacceptable for Xpert Xpress SARS-CoV-2/FLU/RSV testing.  Fact Sheet for Patients: BloggerCourse.com  Fact Sheet for Healthcare Providers: SeriousBroker.it  This test is not yet approved or cleared by the [place] FDA and has been authorized for detection and/or diagnosis of SARS-CoV-2 by FDA under an Emergency Use Authorization (EUA). This EUA will remain in effect (meaning this test can be used) for the duration of the COVID-19 declaration under Section 564(b)(1) of the Act, 21 U.S.C. section 360bbb-3(b)(1), unless the authorization is terminated or revoked.  Performed at [facility], [address], [place], [place] [address]     Renal Function: Recent Labs    07/05/20 2355  CREATININE 1.00   Estimated Creatinine Clearance: 86 mL/min (by C-G formula based on SCr of 1 mg/dL).  Radiologic Imaging: CT ABDOMEN PELVIS WO CONTRAST  Result Date: 07/06/2020 CLINICAL DATA:  Left lower quadrant abdominal pain, back pain EXAM: CT ABDOMEN AND PELVIS WITHOUT CONTRAST TECHNIQUE: Multidetector CT imaging of the abdomen and pelvis was performed following the standard protocol without IV contrast. COMPARISON:  09/19/2017 FINDINGS: Lower chest: Visualized lung bases are clear. The visualized heart and pericardium are unremarkable. Hepatobiliary: No focal liver abnormality is seen. No gallstones, gallbladder wall thickening, or biliary dilatation. Pancreas: Unremarkable Spleen: Unremarkable Adrenals/Urinary Tract: The adrenal glands are  unremarkable. The kidneys are normal in size and position. There is significant interval increase in intrarenal stone burden since prior examination. On the left, a staghorn calculus extends into multiple lower pole calices measuring 15 mm x 32 mm. An additional 8 mm calculus is seen within the lower pole. On the right,  at least 4 calcifications are present with the dominant calculus measuring 13 mm x 19 mm. There has developed mild left hydronephrosis secondary to an obstructing 4 x 6 x 8 mm calculus within the proximal right ureter at the level of the transverse process of L4. The bladder is unremarkable. Stomach/Bowel: Mild diverticulosis of the transverse and ascending colon. The stomach, small bowel, and large bowel are otherwise unremarkable. Appendix normal. No free intraperitoneal gas or fluid. Vascular/Lymphatic: No significant vascular findings are present. No enlarged abdominal or pelvic lymph nodes. Reproductive: Uterus and bilateral adnexa are unremarkable. Other: No abdominal wall hernia. There is extensive subcutaneous soft tissue infiltration involving the dependent subcutaneous fat of the flanks and gluteal regions. This appears similar to prior examination and may represent postsurgical change. The rectum is unremarkable. Musculoskeletal: Transitional lumbar anatomy with a partially sacralized L5 vertebral body. No acute bone abnormality. No lytic or blastic bone lesions are seen. IMPRESSION: Mild left hydronephrosis secondary to an obstructing 4 x 6 x 8 mm calculus within the a proximal left ureter. Superimposed moderate bilateral nonobstructing nephrolithiasis, progressive since prior examination. Electronically Signed   By: Helyn Numbers MD   On: 07/06/2020 01:28    I independently reviewed the above imaging studies.  Impression/Recommendation: 40yo woman with history of nephrolithiasis, obstructing L ureteral stone and concern for UTI. She is HDS with low grade temp.  - recommend left  ureteral stent placement for urinary decompression - Given patient endorses eating around 9:30-10pm but is HDS, will plan for 6-6:30 procedure to allow for full NPO time before anesthesia - please keep NPO - COVID negative - recommend continued empiric antibiotics, narrowed as appropriate pending UCx results - if patient becomes hemodynamically unstable or worsens clinically in the interim please page urology as this may indicate need for more emergent intervention - pain control as needed - flomax, hydration, urine straining   Kathryn Crawford 07/06/2020, 3:10 AM

## 2020-07-06 NOTE — Anesthesia Preprocedure Evaluation (Addendum)
Anesthesia Evaluation  Patient identified by MRN, date of birth, ID band Patient awake    Reviewed: Allergy & Precautions, NPO status , Patient's Chart, lab work & pertinent test results  History of Anesthesia Complications Negative for: history of anesthetic complications  Airway Mallampati: I  TM Distance: >3 FB Neck ROM: Full    Dental  (+) Dental Advisory Given, Teeth Intact   Pulmonary neg pulmonary ROS,    breath sounds clear to auscultation       Cardiovascular (-) hypertension(-) anginanegative cardio ROS   Rhythm:Regular Rate:Normal     Neuro/Psych  Headaches, Anxiety    GI/Hepatic Neg liver ROS, Nausea with this stone   Endo/Other  negative endocrine ROS  Renal/GU stones     Musculoskeletal   Abdominal   Peds  Hematology negative hematology ROS (+)   Anesthesia Other Findings   Reproductive/Obstetrics                            Anesthesia Physical Anesthesia Plan  ASA: II and emergent  Anesthesia Plan: General   Post-op Pain Management:    Induction: Intravenous  PONV Risk Score and Plan: 3 and Ondansetron, Dexamethasone and Treatment may vary due to age or medical condition  Airway Management Planned: Oral ETT  Additional Equipment: None  Intra-op Plan:   Post-operative Plan: Extubation in OR  Informed Consent: I have reviewed the patients History and Physical, chart, labs and discussed the procedure including the risks, benefits and alternatives for the proposed anesthesia with the patient or authorized representative who has indicated his/her understanding and acceptance.     Dental advisory given  Plan Discussed with: CRNA and Surgeon  Anesthesia Plan Comments:        Anesthesia Quick Evaluation

## 2020-07-06 NOTE — ED Notes (Signed)
Save blue tube in main lab °

## 2020-07-06 NOTE — Op Note (Signed)
Operative Note  Preoperative diagnosis:  1.  Bilateral renal calculi, left Ureteral calculus with UTI  Post operative diagnosis: 1.  Bilateral renal calculi, left ureteral calculus with UTI  Procedure(s): 1.  Cystoscopy with left retrograde pyelogram and left ureteral stent placement  Surgeon: Modena Slater, MD  Assistants: None  Anesthesia: General  Complications: None immediate  EBL: Minimal  Specimens: 1.  None  Drains/Catheters: 1.  6 X 26 double-J ureteral stent  Intraoperative findings: 1.  Normal urethra and bladder 2.  Left retrograde pyelogram revealed a filling defect at the level of the stone with upstream hydroureteronephrosis  Indication: 40 year old female with a history of nephrolithiasis presented with fever and left-sided flank pain.  She was found to have a left ureteral calculus and bilateral renal calculi with a large staghorn calculus on the left.  She presents for urgent ureteral stent placement.  Description of procedure:  The patient was identified and consent was obtained.  The patient was taken to the operating room and placed in the supine position.  The patient was placed under general anesthesia.  Perioperative antibiotics were administered.  The patient was placed in dorsal lithotomy.  Patient was prepped and draped in a standard sterile fashion and a timeout was performed.  A 21 French rigid cystoscope was advanced into the urethra and into the bladder.  The left distal most portion of the ureter was cannulated with an open-ended ureteral catheter.  Retrograde pyelogram was performed with the findings noted above.  A sensor wire was then advanced up to the kidney under fluoroscopic guidance.  A 6 X 26 double-J ureteral stent was advanced up to the kidney under fluoroscopic guidance.  The wire was withdrawn and fluoroscopy confirmed good proximal placement and direct visualization confirmed a good coil within the bladder.  The bladder was drained and the  scope withdrawn.  This concluded the operation.  Patient tolerated procedure well and was stable postoperatively.  Plan: Continue IV antibiotics.  Down the road she will need either ureteroscopy to clear the ureter but ultimately would recommend a PCNL for the larger renal calculus.

## 2020-07-06 NOTE — Discharge Instructions (Signed)

## 2020-07-06 NOTE — Hospital Course (Signed)
40 year old woman PMH nephrolithiasis, PE presenting with fever and left flank pain.  CT showed bilateral staghorn stones with mild left hydronephrosis secondary to obstructing left ureteral stone.  Admitted for sepsis secondary to complicated UTI, obstructing left ureteral stone.  Seen by urology, underwent left ureteral stent placement 5/26.  A & P  Sepsis secondary to complicated UTI, obstructing left ureteral stone.   --

## 2020-07-07 ENCOUNTER — Encounter (HOSPITAL_COMMUNITY): Payer: Self-pay | Admitting: Urology

## 2020-07-08 ENCOUNTER — Telehealth: Payer: Self-pay | Admitting: Family Medicine

## 2020-07-08 LAB — URINE CULTURE: Culture: 50000 — AB

## 2020-07-08 NOTE — Telephone Encounter (Signed)
UC noted Proteus sensitive to Amoxicillin. Will complete Augmentin as ordered at discharge. BC no growth to date.  Brendia Sacks, MD [facility]

## 2020-07-11 LAB — CULTURE, BLOOD (ROUTINE X 2)
Culture: NO GROWTH
Culture: NO GROWTH
Special Requests: ADEQUATE
Special Requests: ADEQUATE

## 2020-07-12 ENCOUNTER — Other Ambulatory Visit (HOSPITAL_COMMUNITY): Payer: Self-pay | Admitting: Urology

## 2020-07-12 ENCOUNTER — Other Ambulatory Visit: Payer: Self-pay | Admitting: Urology

## 2020-07-12 DIAGNOSIS — N200 Calculus of kidney: Secondary | ICD-10-CM

## 2020-08-17 NOTE — Progress Notes (Signed)
DUE TO COVID-19 ONLY ONE VISITOR IS ALLOWED TO COME WITH YOU AND STAY IN THE WAITING ROOM ONLY DURING PRE OP AND PROCEDURE DAY OF SURGERY. THE 1 VISITOR  MAY VISIT WITH YOU AFTER SURGERY IN YOUR PRIVATE ROOM DURING VISITING HOURS ONLY!  YOU NEED TO HAVE A COVID 19 TEST ON___7/21/22 ____ @_______ , THIS TEST MUST BE DONE BEFORE SURGERY,  COVID TESTING SITE [address] [place] , IT IS ON THE RIGHT GOING OUT [address] APPROXIMATELY  2 MINUTES PAST [employer] ON THE RIGHT. ONCE YOUR COVID TEST IS COMPLETED,  PLEASE BEGIN THE QUARANTINE INSTRUCTIONS AS OUTLINED IN YOUR HANDOUT.                Kathryn Crawford  08/17/2020   Your procedure is scheduled on:     09/04/20   Report to [facility] Main  Entrance   Report to admitting at     0800AM     Call this number if you have problems the morning of surgery [phone]    Remember: Do not eat food , candy gum or mints :After Midnight. You may have clear liquids from midnight until  0630am    CLEAR LIQUID DIET   Foods Allowed                                                                       Coffee and tea, regular and decaf                              Plain Jell-O any favor except red or purple                                            Fruit ices (not with fruit pulp)                                      Iced Popsicles                                     Carbonated beverages, regular and diet                                    Cranberry, grape and apple juices Sports drinks like Gatorade Lightly seasoned clear broth or consume(fat free) Sugar, honey syrup   _____________________________________________________________________    BRUSH YOUR TEETH MORNING OF SURGERY AND RINSE YOUR MOUTH OUT, NO CHEWING GUM CANDY OR MINTS.     Take these medicines the morning of surgery with A SIP OF WATER:  flomax   DO NOT TAKE ANY DIABETIC MEDICATIONS DAY OF YOUR SURGERY                                You may not have  any metal on your body including hair pins and              piercings  Do not wear jewelry, make-up, lotions, powders or perfumes, deodorant             Do not wear nail polish on your fingernails.  Do not shave  48 hours prior to surgery.              Men may shave face and neck.   Do not bring valuables to the hospital. Kathryn Crawford.  Contacts, dentures or bridgework may not be worn into surgery.  Leave suitcase in the car. After surgery it may be brought to your room.     Patients discharged the day of surgery will not be allowed to drive home. IF YOU ARE HAVING SURGERY AND GOING HOME THE SAME DAY, YOU MUST HAVE AN ADULT TO DRIVE YOU HOME AND BE WITH YOU FOR 24 HOURS. YOU MAY GO HOME BY TAXI OR UBER OR ORTHERWISE, BUT AN ADULT MUST ACCOMPANY YOU HOME AND STAY WITH YOU FOR 24 HOURS.  Name and phone number of your driver:  Special Instructions: N/A              Please read over the following fact sheets you were given: _____________________________________________________________________  [facility] - Preparing for Surgery Before surgery, you can play an important role.  Because skin is not sterile, your skin needs to be as free of germs as possible.  You can reduce the number of germs on your skin by washing with CHG (chlorahexidine gluconate) soap before surgery.  CHG is an antiseptic cleaner which kills germs and bonds with the skin to continue killing germs even after washing. Please DO NOT use if you have an allergy to CHG or antibacterial soaps.  If your skin becomes reddened/irritated stop using the CHG and inform your nurse when you arrive at Short Stay. Do not shave (including legs and underarms) for at least 48 hours prior to the first CHG shower.  You may shave your face/neck. Please follow these instructions carefully:  1.  Shower with CHG Soap the night before surgery and the  morning of Surgery.  2.  If  you choose to wash your hair, wash your hair first as usual with your  normal  shampoo.  3.  After you shampoo, rinse your hair and body thoroughly to remove the  shampoo.                           4.  Use CHG as you would any other liquid soap.  You can apply chg directly  to the skin and wash                       Gently with a scrungie or clean washcloth.  5.  Apply the CHG Soap to your body ONLY FROM THE NECK DOWN.   Do not use on face/ open                           Wound or open sores. Avoid contact with eyes, ears mouth and genitals (private parts).                       Wash  face,  Genitals (private parts) with your normal soap.             6.  Wash thoroughly, paying special attention to the area where your surgery  will be performed.  7.  Thoroughly rinse your body with warm water from the neck down.  8.  DO NOT shower/wash with your normal soap after using and rinsing off  the CHG Soap.                9.  Pat yourself dry with a clean towel.            10.  Wear clean pajamas.            11.  Place clean sheets on your bed the night of your first shower and do not  sleep with pets. Day of Surgery : Do not apply any lotions/deodorants the morning of surgery.  Please wear clean clothes to the hospital/surgery center.  FAILURE TO FOLLOW THESE INSTRUCTIONS MAY RESULT IN THE CANCELLATION OF YOUR SURGERY PATIENT SIGNATURE_________________________________  NURSE SIGNATURE__________________________________  ________________________________________________________________________

## 2020-08-22 ENCOUNTER — Other Ambulatory Visit: Payer: Self-pay

## 2020-08-22 ENCOUNTER — Encounter (HOSPITAL_COMMUNITY)
Admission: RE | Admit: 2020-08-22 | Discharge: 2020-08-22 | Disposition: A | Discharge status: Home / Self Care | Payer: No Typology Code available for payment source | Source: Ambulatory Visit | Attending: Urology | Admitting: Urology

## 2020-08-22 ENCOUNTER — Encounter (HOSPITAL_COMMUNITY): Payer: Self-pay

## 2020-08-22 DIAGNOSIS — Z01812 Encounter for preprocedural laboratory examination: Secondary | ICD-10-CM | POA: Insufficient documentation

## 2020-08-22 HISTORY — DX: Anemia, unspecified: D64.9

## 2020-08-22 LAB — CBC
HCT: 35.1 % — ABNORMAL LOW (ref 36.0–46.0)
Hemoglobin: 11.1 g/dL — ABNORMAL LOW (ref 12.0–15.0)
MCH: 28.5 pg (ref 26.0–34.0)
MCHC: 31.6 g/dL (ref 30.0–36.0)
MCV: 90.2 fL (ref 80.0–100.0)
Platelets: 352 10*3/uL (ref 150–400)
RBC: 3.89 MIL/uL (ref 3.87–5.11)
RDW: 13.1 % (ref 11.5–15.5)
WBC: 6.5 10*3/uL (ref 4.0–10.5)
nRBC: 0 % (ref 0.0–0.2)

## 2020-08-22 LAB — PROTIME-INR
INR: 1 (ref 0.8–1.2)
Prothrombin Time: 13.5 seconds (ref 11.4–15.2)

## 2020-08-22 LAB — TYPE AND SCREEN
ABO/RH(D): A POS
Antibody Screen: NEGATIVE

## 2020-08-22 LAB — BASIC METABOLIC PANEL
Anion gap: 9 (ref 5–15)
BUN: 24 mg/dL — ABNORMAL HIGH (ref 6–20)
CO2: 25 mmol/L (ref 22–32)
Calcium: 9.8 mg/dL (ref 8.9–10.3)
Chloride: 103 mmol/L (ref 98–111)
Creatinine, Ser: 1.17 mg/dL — ABNORMAL HIGH (ref 0.44–1.00)
GFR, Estimated: >60 mL/min (ref >60)
Glucose, Bld: 119 mg/dL — ABNORMAL HIGH (ref 70–99)
Potassium: 3.6 mmol/L (ref 3.5–5.1)
Sodium: 137 mmol/L (ref 135–145)

## 2020-08-22 NOTE — Progress Notes (Signed)
Anesthesia Review:  PCP: Cardiologist : Chest x-ray : EKG : Echo : Stress test: Cardiac Cath :  Activity level:  Sleep Study/ CPAP : Fasting Blood Sugar :      / Checks Blood Sugar -- times a day:   Blood Thinner/ Instructions /Last Dose: ASA / Instructions/ Last Dose :   Pt was 35 minutes late for preop appt.  Medical and surgical hx completed along with travel screening and instructions only.

## 2020-08-31 ENCOUNTER — Other Ambulatory Visit (HOSPITAL_COMMUNITY)
Admission: RE | Admit: 2020-08-31 | Discharge: 2020-08-31 | Disposition: A | Discharge status: Home / Self Care | Payer: No Typology Code available for payment source | Source: Ambulatory Visit | Attending: Urology | Admitting: Urology

## 2020-08-31 DIAGNOSIS — Z20822 Contact with and (suspected) exposure to covid-19: Secondary | ICD-10-CM | POA: Diagnosis not present

## 2020-08-31 DIAGNOSIS — Z01812 Encounter for preprocedural laboratory examination: Secondary | ICD-10-CM | POA: Insufficient documentation

## 2020-08-31 LAB — SARS CORONAVIRUS 2 (TAT 6-24 HRS): SARS Coronavirus 2: NEGATIVE

## 2020-09-01 ENCOUNTER — Other Ambulatory Visit: Payer: Self-pay | Admitting: Student

## 2020-09-04 ENCOUNTER — Encounter (HOSPITAL_COMMUNITY): Payer: Self-pay | Admitting: Interventional Radiology

## 2020-09-04 ENCOUNTER — Ambulatory Visit (HOSPITAL_COMMUNITY): Payer: No Typology Code available for payment source | Admitting: Anesthesiology

## 2020-09-04 ENCOUNTER — Inpatient Hospital Stay (HOSPITAL_COMMUNITY)
Admission: RE | Admit: 2020-09-04 | Discharge: 2020-09-08 | DRG: 856 | Disposition: A | Discharge status: Home / Self Care | Payer: No Typology Code available for payment source | Source: Ambulatory Visit | Attending: Urology | Admitting: Urology

## 2020-09-04 ENCOUNTER — Other Ambulatory Visit: Payer: Self-pay

## 2020-09-04 ENCOUNTER — Ambulatory Visit (HOSPITAL_COMMUNITY)
Admission: RE | Admit: 2020-09-04 | Discharge: 2020-09-04 | Disposition: A | Discharge status: Home / Self Care | Payer: No Typology Code available for payment source | Source: Ambulatory Visit | Attending: Urology | Admitting: Urology

## 2020-09-04 ENCOUNTER — Ambulatory Visit (HOSPITAL_COMMUNITY): Payer: No Typology Code available for payment source

## 2020-09-04 ENCOUNTER — Encounter (HOSPITAL_COMMUNITY)
Admission: RE | Disposition: A | Discharge status: Home / Self Care | Payer: Self-pay | Source: Ambulatory Visit | Attending: Urology

## 2020-09-04 DIAGNOSIS — N179 Acute kidney failure, unspecified: Secondary | ICD-10-CM | POA: Diagnosis present

## 2020-09-04 DIAGNOSIS — Z87891 Personal history of nicotine dependence: Secondary | ICD-10-CM

## 2020-09-04 DIAGNOSIS — E872 Acidosis: Secondary | ICD-10-CM | POA: Diagnosis present

## 2020-09-04 DIAGNOSIS — R509 Fever, unspecified: Secondary | ICD-10-CM

## 2020-09-04 DIAGNOSIS — T8144XA Sepsis following a procedure, initial encounter: Principal | ICD-10-CM | POA: Diagnosis present

## 2020-09-04 DIAGNOSIS — N390 Urinary tract infection, site not specified: Secondary | ICD-10-CM | POA: Diagnosis present

## 2020-09-04 DIAGNOSIS — Y831 Surgical operation with implant of artificial internal device as the cause of abnormal reaction of the patient, or of later complication, without mention of misadventure at the time of the procedure: Secondary | ICD-10-CM | POA: Diagnosis present

## 2020-09-04 DIAGNOSIS — Z803 Family history of malignant neoplasm of breast: Secondary | ICD-10-CM

## 2020-09-04 DIAGNOSIS — N200 Calculus of kidney: Secondary | ICD-10-CM | POA: Insufficient documentation

## 2020-09-04 DIAGNOSIS — N202 Calculus of kidney with calculus of ureter: Secondary | ICD-10-CM | POA: Diagnosis present

## 2020-09-04 DIAGNOSIS — Z86711 Personal history of pulmonary embolism: Secondary | ICD-10-CM

## 2020-09-04 DIAGNOSIS — A414 Sepsis due to anaerobes: Secondary | ICD-10-CM | POA: Diagnosis present

## 2020-09-04 HISTORY — PX: NEPHROLITHOTOMY: SHX5134

## 2020-09-04 HISTORY — PX: IR URETERAL STENT LEFT NEW ACCESS W/O SEP NEPHROSTOMY CATH: IMG6075

## 2020-09-04 LAB — HEMOGLOBIN AND HEMATOCRIT, BLOOD
HCT: 36.6 % (ref 36.0–46.0)
Hemoglobin: 11.7 g/dL — ABNORMAL LOW (ref 12.0–15.0)

## 2020-09-04 LAB — APTT: aPTT: 26 s (ref 24–36)

## 2020-09-04 LAB — PROTIME-INR
INR: 1 (ref 0.8–1.2)
Prothrombin Time: 13.4 seconds (ref 11.4–15.2)

## 2020-09-04 LAB — BASIC METABOLIC PANEL
Anion gap: 6 (ref 5–15)
BUN: 27 mg/dL — ABNORMAL HIGH (ref 6–20)
CO2: 26 mmol/L (ref 22–32)
Calcium: 9.6 mg/dL (ref 8.9–10.3)
Chloride: 107 mmol/L (ref 98–111)
Creatinine, Ser: 1.13 mg/dL — ABNORMAL HIGH (ref 0.44–1.00)
GFR, Estimated: >60 mL/min (ref >60)
Glucose, Bld: 118 mg/dL — ABNORMAL HIGH (ref 70–99)
Potassium: 4.1 mmol/L (ref 3.5–5.1)
Sodium: 139 mmol/L (ref 135–145)

## 2020-09-04 LAB — PREGNANCY, URINE: Preg Test, Ur: NEGATIVE

## 2020-09-04 LAB — CBC
HCT: 36.4 % (ref 36.0–46.0)
Hemoglobin: 11.8 g/dL — ABNORMAL LOW (ref 12.0–15.0)
MCH: 29 pg (ref 26.0–34.0)
MCHC: 32.4 g/dL (ref 30.0–36.0)
MCV: 89.4 fL (ref 80.0–100.0)
Platelets: 359 10*3/uL (ref 150–400)
RBC: 4.07 MIL/uL (ref 3.87–5.11)
RDW: 13.7 % (ref 11.5–15.5)
WBC: 8.9 10*3/uL (ref 4.0–10.5)
nRBC: 0 % (ref 0.0–0.2)

## 2020-09-04 LAB — ABO/RH: ABO/RH(D): A POS

## 2020-09-04 SURGERY — NEPHROLITHOTOMY PERCUTANEOUS
Anesthesia: General | Site: Flank | Laterality: Left

## 2020-09-04 MED ORDER — IOHEXOL 300 MG/ML  SOLN
10.0000 mL | Freq: Once | INTRAMUSCULAR | Status: AC | PRN
  Administered 2020-09-04: 10 mL

## 2020-09-04 MED ORDER — MIDAZOLAM HCL 2 MG/2ML IJ SOLN
INTRAMUSCULAR | Status: AC | PRN
  Administered 2020-09-04 (×4): 1 mg via INTRAVENOUS

## 2020-09-04 MED ORDER — FENTANYL CITRATE (PF) 100 MCG/2ML IJ SOLN
INTRAMUSCULAR | Status: AC
  Filled 2020-09-04: qty 2

## 2020-09-04 MED ORDER — SODIUM CHLORIDE 0.9 % IV SOLN: INTRAVENOUS | Status: DC

## 2020-09-04 MED ORDER — FENTANYL CITRATE (PF) 100 MCG/2ML IJ SOLN
25.0000 ug | INTRAMUSCULAR | Status: DC | PRN
  Administered 2020-09-04 (×2): 50 ug via INTRAVENOUS

## 2020-09-04 MED ORDER — PROPOFOL 10 MG/ML IV BOLUS
INTRAVENOUS | Status: DC | PRN
  Administered 2020-09-04: 200 mg via INTRAVENOUS

## 2020-09-04 MED ORDER — ONDANSETRON HCL 4 MG/2ML IJ SOLN
INTRAMUSCULAR | Status: AC
  Filled 2020-09-04: qty 2

## 2020-09-04 MED ORDER — HYDROMORPHONE HCL 1 MG/ML IJ SOLN
INTRAMUSCULAR | Status: AC
  Administered 2020-09-04: 0.5 mg via INTRAVENOUS
  Filled 2020-09-04: qty 1

## 2020-09-04 MED ORDER — HYDROMORPHONE HCL 1 MG/ML IJ SOLN
0.5000 mg | INTRAMUSCULAR | Status: DC | PRN
  Administered 2020-09-04 – 2020-09-06 (×7): 0.5 mg via INTRAVENOUS
  Filled 2020-09-04 (×7): qty 0.5

## 2020-09-04 MED ORDER — ZOLPIDEM TARTRATE 5 MG PO TABS
5.0000 mg | ORAL_TABLET | Freq: Every evening | ORAL | Status: DC | PRN
  Administered 2020-09-08: 5 mg via ORAL
  Filled 2020-09-04: qty 1

## 2020-09-04 MED ORDER — HYDROCODONE-ACETAMINOPHEN 5-325 MG PO TABS
1.0000 | ORAL_TABLET | ORAL | Status: DC | PRN
  Administered 2020-09-04 – 2020-09-07 (×7): 1 via ORAL
  Filled 2020-09-04 (×7): qty 1

## 2020-09-04 MED ORDER — SENNA 8.6 MG PO TABS
1.0000 | ORAL_TABLET | Freq: Two times a day (BID) | ORAL | Status: DC
  Administered 2020-09-04 – 2020-09-08 (×8): 8.6 mg via ORAL
  Filled 2020-09-04 (×8): qty 1

## 2020-09-04 MED ORDER — FENTANYL CITRATE (PF) 100 MCG/2ML IJ SOLN
INTRAMUSCULAR | Status: AC
  Administered 2020-09-04: 50 ug via INTRAVENOUS
  Filled 2020-09-04: qty 4

## 2020-09-04 MED ORDER — DOCUSATE SODIUM 100 MG PO CAPS
100.0000 mg | ORAL_CAPSULE | Freq: Two times a day (BID) | ORAL | Status: DC
  Administered 2020-09-04 – 2020-09-08 (×8): 100 mg via ORAL
  Filled 2020-09-04 (×8): qty 1

## 2020-09-04 MED ORDER — ROCURONIUM BROMIDE 10 MG/ML (PF) SYRINGE
PREFILLED_SYRINGE | INTRAVENOUS | Status: DC | PRN
  Administered 2020-09-04: 50 mg via INTRAVENOUS

## 2020-09-04 MED ORDER — MIDAZOLAM HCL 2 MG/2ML IJ SOLN
INTRAMUSCULAR | Status: DC | PRN
  Administered 2020-09-04: 2 mg via INTRAVENOUS

## 2020-09-04 MED ORDER — SODIUM CHLORIDE 0.9 % IR SOLN
Status: DC | PRN
  Administered 2020-09-04 (×3): 3000 mL via INTRAVESICAL

## 2020-09-04 MED ORDER — FENTANYL CITRATE (PF) 100 MCG/2ML IJ SOLN
INTRAMUSCULAR | Status: AC
  Filled 2020-09-04: qty 4

## 2020-09-04 MED ORDER — DEXAMETHASONE SODIUM PHOSPHATE 10 MG/ML IJ SOLN
INTRAMUSCULAR | Status: DC | PRN
  Administered 2020-09-04: 4 mg via INTRAVENOUS

## 2020-09-04 MED ORDER — LIDOCAINE HCL 1 % IJ SOLN
INTRAMUSCULAR | Status: AC
  Filled 2020-09-04: qty 20

## 2020-09-04 MED ORDER — SODIUM CHLORIDE 0.9 % IV SOLN
INTRAVENOUS | Status: AC
  Administered 2020-09-04: 2 g via INTRAVENOUS
  Filled 2020-09-04: qty 2

## 2020-09-04 MED ORDER — OXYBUTYNIN CHLORIDE 5 MG PO TABS
5.0000 mg | ORAL_TABLET | Freq: Three times a day (TID) | ORAL | Status: DC | PRN
  Administered 2020-09-06: 5 mg via ORAL
  Filled 2020-09-04: qty 1

## 2020-09-04 MED ORDER — SODIUM CHLORIDE 0.9 % IV SOLN
2.0000 g | INTRAVENOUS | Status: AC
  Filled 2020-09-04: qty 2

## 2020-09-04 MED ORDER — AMISULPRIDE (ANTIEMETIC) 5 MG/2ML IV SOLN: 10.0000 mg | Freq: Once | INTRAVENOUS | Status: DC | PRN

## 2020-09-04 MED ORDER — IOHEXOL 300 MG/ML  SOLN
INTRAMUSCULAR | Status: DC | PRN
  Administered 2020-09-04: 10 mL

## 2020-09-04 MED ORDER — CHLORHEXIDINE GLUCONATE CLOTH 2 % EX PADS
6.0000 | MEDICATED_PAD | Freq: Every day | CUTANEOUS | Status: DC
  Administered 2020-09-05 – 2020-09-08 (×3): 6 via TOPICAL

## 2020-09-04 MED ORDER — ACETAMINOPHEN 325 MG PO TABS
650.0000 mg | ORAL_TABLET | ORAL | Status: DC | PRN
  Administered 2020-09-05 – 2020-09-07 (×3): 650 mg via ORAL
  Filled 2020-09-04 (×3): qty 2

## 2020-09-04 MED ORDER — PROPOFOL 10 MG/ML IV BOLUS
INTRAVENOUS | Status: AC
  Filled 2020-09-04: qty 20

## 2020-09-04 MED ORDER — ONDANSETRON HCL 4 MG/2ML IJ SOLN
4.0000 mg | INTRAMUSCULAR | Status: DC | PRN
  Administered 2020-09-04 – 2020-09-06 (×4): 4 mg via INTRAVENOUS
  Filled 2020-09-04 (×4): qty 2

## 2020-09-04 MED ORDER — SODIUM CHLORIDE 0.9 % IV SOLN
2.0000 g | Freq: Three times a day (TID) | INTRAVENOUS | Status: AC
  Administered 2020-09-04 – 2020-09-05 (×2): 2 g via INTRAVENOUS
  Filled 2020-09-04 (×2): qty 2

## 2020-09-04 MED ORDER — ACETAMINOPHEN 500 MG PO TABS
1000.0000 mg | ORAL_TABLET | Freq: Once | ORAL | Status: AC
  Administered 2020-09-04: 1000 mg via ORAL
  Filled 2020-09-04: qty 2

## 2020-09-04 MED ORDER — DIPHENHYDRAMINE HCL 50 MG/ML IJ SOLN: 12.5000 mg | Freq: Four times a day (QID) | INTRAMUSCULAR | Status: DC | PRN

## 2020-09-04 MED ORDER — BACITRACIN-NEOMYCIN-POLYMYXIN 400-5-5000 EX OINT: 1.0000 "application " | TOPICAL_OINTMENT | Freq: Three times a day (TID) | CUTANEOUS | Status: DC | PRN

## 2020-09-04 MED ORDER — DIPHENHYDRAMINE HCL 12.5 MG/5ML PO ELIX: 12.5000 mg | ORAL_SOLUTION | Freq: Four times a day (QID) | ORAL | Status: DC | PRN

## 2020-09-04 MED ORDER — OXYCODONE HCL 5 MG PO TABS: 5.0000 mg | ORAL_TABLET | ORAL | Status: DC | PRN

## 2020-09-04 MED ORDER — LACTATED RINGERS IV SOLN: INTRAVENOUS | Status: DC | PRN

## 2020-09-04 MED ORDER — LIDOCAINE 2% (20 MG/ML) 5 ML SYRINGE
INTRAMUSCULAR | Status: DC | PRN
Start: 2020-09-04 — End: 2020-09-04
  Administered 2020-09-04: 60 mg via INTRAVENOUS

## 2020-09-04 MED ORDER — LIDOCAINE HCL (PF) 1 % IJ SOLN
INTRAMUSCULAR | Status: AC | PRN
  Administered 2020-09-04 (×2): 10 mL via INTRADERMAL

## 2020-09-04 MED ORDER — LIDOCAINE 2% (20 MG/ML) 5 ML SYRINGE
INTRAMUSCULAR | Status: AC
  Filled 2020-09-04: qty 5

## 2020-09-04 MED ORDER — ONDANSETRON HCL 4 MG/2ML IJ SOLN
INTRAMUSCULAR | Status: DC | PRN
  Administered 2020-09-04: 4 mg via INTRAVENOUS

## 2020-09-04 MED ORDER — MIDAZOLAM HCL 2 MG/2ML IJ SOLN
INTRAMUSCULAR | Status: AC
  Filled 2020-09-04: qty 4

## 2020-09-04 MED ORDER — FENTANYL CITRATE (PF) 100 MCG/2ML IJ SOLN
INTRAMUSCULAR | Status: AC | PRN
  Administered 2020-09-04 (×4): 50 ug via INTRAVENOUS

## 2020-09-04 MED ORDER — MORPHINE SULFATE (PF) 2 MG/ML IV SOLN: 2.0000 mg | INTRAVENOUS | Status: DC | PRN

## 2020-09-04 MED ORDER — CEFAZOLIN SODIUM-DEXTROSE 2-4 GM/100ML-% IV SOLN: 2.0000 g | Freq: Three times a day (TID) | INTRAVENOUS | Status: DC

## 2020-09-04 MED ORDER — SODIUM CHLORIDE 0.9 % IV SOLN
2.0000 g | Freq: Once | INTRAVENOUS | Status: DC
  Filled 2020-09-04: qty 20

## 2020-09-04 MED ORDER — TAMSULOSIN HCL 0.4 MG PO CAPS
0.4000 mg | ORAL_CAPSULE | Freq: Every day | ORAL | Status: DC
  Administered 2020-09-04 – 2020-09-08 (×5): 0.4 mg via ORAL
  Filled 2020-09-04 (×5): qty 1

## 2020-09-04 MED ORDER — BELLADONNA ALKALOIDS-OPIUM 16.2-60 MG RE SUPP: 1.0000 | Freq: Four times a day (QID) | RECTAL | Status: DC | PRN

## 2020-09-04 MED ORDER — LACTATED RINGERS IV SOLN: INTRAVENOUS | Status: DC

## 2020-09-04 MED ORDER — DEXAMETHASONE SODIUM PHOSPHATE 10 MG/ML IJ SOLN
INTRAMUSCULAR | Status: AC
  Filled 2020-09-04: qty 1

## 2020-09-04 MED ORDER — HYDROMORPHONE HCL 1 MG/ML IJ SOLN
0.2500 mg | INTRAMUSCULAR | Status: DC | PRN
  Administered 2020-09-04 (×2): 0.5 mg via INTRAVENOUS

## 2020-09-04 MED ORDER — FENTANYL CITRATE (PF) 100 MCG/2ML IJ SOLN
INTRAMUSCULAR | Status: DC | PRN
  Administered 2020-09-04: 100 ug via INTRAVENOUS

## 2020-09-04 MED ORDER — MIDAZOLAM HCL 2 MG/2ML IJ SOLN
INTRAMUSCULAR | Status: AC
  Filled 2020-09-04: qty 2

## 2020-09-04 SURGICAL SUPPLY — 51 items
APL PRP STRL LF DISP 70% ISPRP (MISCELLANEOUS) ×1
APL SKNCLS STERI-STRIP NONHPOA (GAUZE/BANDAGES/DRESSINGS) ×1
BAG COUNTER SPONGE SURGICOUNT (BAG) IMPLANT
BAG DRN RND TRDRP ANRFLXCHMBR (UROLOGICAL SUPPLIES)
BAG SPNG CNTER NS LX DISP (BAG)
BAG URINE DRAIN 2000ML AR STRL (UROLOGICAL SUPPLIES) IMPLANT
BASKET ZERO TIP NITINOL 2.4FR (BASKET) ×1 IMPLANT
BENZOIN TINCTURE PRP APPL 2/3 (GAUZE/BANDAGES/DRESSINGS) ×2 IMPLANT
BLADE SURG 15 STRL LF DISP TIS (BLADE) ×1 IMPLANT
BLADE SURG 15 STRL SS (BLADE) ×2
BSKT STON RTRVL ZERO TP 2.4FR (BASKET) ×1
CATH FOLEY 2W COUNCIL 20FR 5CC (CATHETERS) IMPLANT
CATH ROBINSON RED A/P 20FR (CATHETERS) IMPLANT
CATH URET DUAL LUMEN 6-10FR 50 (CATHETERS) ×2 IMPLANT
CATH X-FORCE N30 NEPHROSTOMY (TUBING) ×2 IMPLANT
CHLORAPREP W/TINT 26 (MISCELLANEOUS) ×2 IMPLANT
COVER SURGICAL LIGHT HANDLE (MISCELLANEOUS) IMPLANT
DRAPE C-ARM 42X120 X-RAY (DRAPES) ×2 IMPLANT
DRAPE LINGEMAN PERC (DRAPES) ×2 IMPLANT
DRAPE SURG IRRIG POUCH 19X23 (DRAPES) ×2 IMPLANT
DRSG PAD ABDOMINAL 8X10 ST (GAUZE/BANDAGES/DRESSINGS) ×4 IMPLANT
DRSG TEGADERM 8X12 (GAUZE/BANDAGES/DRESSINGS) IMPLANT
GAUZE SPONGE 4X4 12PLY STRL (GAUZE/BANDAGES/DRESSINGS) ×2 IMPLANT
GLOVE SURG ENC MOIS LTX SZ7.5 (GLOVE) ×2 IMPLANT
GOWN STRL REUS W/TWL XL LVL3 (GOWN DISPOSABLE) ×2 IMPLANT
GUIDEWIRE AMPLAZ .035X145 (WIRE) ×4 IMPLANT
KIT BASIN OR (CUSTOM PROCEDURE TRAY) ×2 IMPLANT
KIT PROBE TRILOGY 3.9X350 (MISCELLANEOUS) IMPLANT
KIT TURNOVER KIT A (KITS) ×2 IMPLANT
LASER FIB FLEXIVA PULSE ID 365 (Laser) IMPLANT
MANIFOLD NEPTUNE II (INSTRUMENTS) ×2 IMPLANT
NS IRRIG 1000ML POUR BTL (IV SOLUTION) ×2 IMPLANT
PACK CYSTO (CUSTOM PROCEDURE TRAY) ×2 IMPLANT
SPONGE T-LAP 4X18 ~~LOC~~+RFID (SPONGE) ×2 IMPLANT
STENT ENDOURETEROTOMY 7-14 26C (STENTS) IMPLANT
STENT URET 6FRX26 CONTOUR (STENTS) ×1 IMPLANT
SUT CHROMIC 3 0 SH 27 (SUTURE) IMPLANT
SUT MNCRL AB 4-0 PS2 18 (SUTURE) ×1 IMPLANT
SUT SILK 2 0 30  PSL (SUTURE)
SUT SILK 2 0 30 PSL (SUTURE) IMPLANT
SYR 10ML LL (SYRINGE) ×2 IMPLANT
SYR 20ML LL LF (SYRINGE) ×4 IMPLANT
TOWEL OR 17X26 10 PK STRL BLUE (TOWEL DISPOSABLE) ×2 IMPLANT
TOWEL OR NON WOVEN STRL DISP B (DISPOSABLE) ×2 IMPLANT
TRACTIP FLEXIVA PULS ID 200XHI (Laser) IMPLANT
TRACTIP FLEXIVA PULSE ID 200 (Laser) ×2
TRAY FOLEY MTR SLVR 16FR STAT (SET/KITS/TRAYS/PACK) ×2 IMPLANT
TUBING CONNECTING 10 (TUBING) ×4 IMPLANT
TUBING STONE CATCHER TRILOGY (MISCELLANEOUS) IMPLANT
TUBING UROLOGY SET (TUBING) ×2 IMPLANT
WATER STERILE IRR 1000ML POUR (IV SOLUTION) ×2 IMPLANT

## 2020-09-04 NOTE — Op Note (Signed)
Operative Note  Preoperative diagnosis:  1.  Left renal calculus and ureteral  Postoperative diagnosis: 1.  Left renal calculus and ureteral  Procedure(s): 1.  Left percutaneous nephrolithotomy 2.  Left antegrade ureteroscopy with laser lithotripsy 3.  Antegrade ureteral stent removal and replacement  Surgeon: Modena Slater, MD  Assistants: None  Anesthesia: General  Complications: None immediate  EBL: 50 cc  Specimens: 1.  Renal calculus  Drains/Catheters: 1.  6 x 26 double-J ureteral stent  Intraoperative findings: Greater than 2 cm of stone burden in the left kidney.  The stone was soft and was easily broken and extracted with graspers.  Ureteroscopy revealed an impacted stone that was laser fragmented to tiny fragments.  No other stone fragments in the ureter.  Indication: 40 year old female with a large left renal calculus and ureteral calculus status post stent placement presents for the previously mentioned operation.  Description of procedure:  The patient was identified and consent was obtained.  The patient was taken to the operating room and placed in the supine position.  The patient was placed under general anesthesia.  Perioperative antibiotics were administered.  The patient was placed in prone position and all pressure points were padded.  Patient was prepped and draped in a standard sterile fashion and a timeout was performed.  A Super Stiff wire was advanced through the nephroureteral stent down to the bladder under fluoroscopic guidance and the nephroureteral stent was removed.  A dual-lumen ureteral catheter was advanced over the Super Stiff wire into the renal pelvis and an antegrade nephrostogram was performed.  This showed a well opacified kidney and a filling defect corresponding to the stone of interest.  I advanced the dual-lumen ureteral catheter into the proximal ureter under fluoroscopic guidance followed by placement of a second Super Stiff wire down  to the bladder under fluoroscopic guidance.  The dual-lumen catheter was removed.  An incision was made alongside the wires.  The balloon dilator was then advanced over one of the wires and into the renal pelvis fluoroscopic guidance and the tract was dilated to a pressure of 18.  The sheath was advanced over the balloon and into the renal pelvis.  The balloon was withdrawn keeping the sheath in place.  The nephroscope was advanced into the kidney and the stone of interest was encountered.  All stone was removed with graspers and the ureteral stent was also removed and there was no evidence of any other stones within the kidney.    I advanced the ureteroscope down the ureter and the stone was encountered.  This was laser fragmented to tiny fragments.  I did basket some stone fragments.  I was unable to pass the scope all the way down to the bladder.  No other stone fragments were seen.  I removed the scope visualizing the entire ureter upon removal and there were no clinically significant stone fragments remaining.  I reinspected the kidney with the nephroscope and did not see any other stone fragments.  Spot fluoroscopy did not show any radiopaque calculi remaining.  The nephroscope along with the sheath were withdrawn.    A 6 x 24 double-J ureteral stent was advanced over 1 of the wires under fluoroscopic guidance and the wire was withdrawn.  Fluoroscopy confirmed a good coil within the bladder as well as a good coil in the renal pelvis proximally.  The skin was closed with 4-0 Monocryl.  This concluded the operation.  The patient tolerated procedure well and was stable postoperatively.  Plan: Patient will remain under observation overnight. Stent to be removed in 1-2 weeks.

## 2020-09-04 NOTE — Procedures (Signed)
Vascular and Interventional Radiology Procedure Note  Patient: Kathryn Crawford DOB: Dec 08, 1980 Medical Record Number: [id number] Note Date/Time: 09/04/20 10:35 AM   Performing Physician: Roanna Banning, MD Assistant(s): None  Diagnosis: Nephrolithiasis  Procedure:  LEFT NEPHROURETERAL TUBE PLACEMENT LEFT ANTEROGRADE NEPHROSTOGRAM  Anesthesia: Conscious Sedation Complications: None Estimated Blood Loss:  <5 mL Specimens:  None  Findings:  Successful placement of left-sided, 5 F nephroureteral catheter tube into the left kidney(s).  Tip of catheter is within the urinary bladder.  Plan: Pt to proceed with PCNL per Urology.  See detailed procedure note with images in PACS. The patient tolerated the procedure well without incident or complication and was returned to Recovery in stable condition.    Roanna Banning, MD [facility] Radiology   Clinic: [phone]

## 2020-09-04 NOTE — H&P (Signed)
H&P  Chief Complaint: Bilateral renal calculi, left ureteral calculus  History of Present Illness: 40 year old female with bilateral renal calculi and a left ureteral calculus status post urgent left ureteral stent for obstructing calculus with UTI.  She presents for left PCNL with antegrade ureteroscopy, possible retrograde.  Past Medical History:  Diagnosis Date   Anemia    Anxiety    History of kidney stones    Pulmonary embolism [facility])    Past Surgical History:  Procedure Laterality Date   BREAST ENHANCEMENT SURGERY  2008   CYSTOSCOPY WITH RETROGRADE PYELOGRAM, URETEROSCOPY AND STENT PLACEMENT Left 07/06/2020   Procedure: CYSTOSCOPY WITH RETROGRADE PYELOGRAM, URETEROSCOPY AND STENT PLACEMENT;  Surgeon: Crista Elliot, MD;  Location: [facility];  Service: Urology;  Laterality: Left;   CYSTOSCOPY WITH STENT PLACEMENT Left 09/19/2017   Procedure: CYSTOSCOPY WITH STENT PLACEMENT;  Surgeon: Ihor Gully, MD;  Location: [facility];  Service: Urology;  Laterality: Left;   EXTRACORPOREAL SHOCK WAVE LITHOTRIPSY Left 10/16/2017   Procedure: LEFT EXTRACORPOREAL SHOCK WAVE LITHOTRIPSY (ESWL);  Surgeon: Ihor Gully, MD;  Location: [facility];  Service: Urology;  Laterality: Left;   IR URETERAL STENT LEFT NEW ACCESS W/O SEP NEPHROSTOMY CATH  09/04/2020   TUBAL LIGATION Bilateral     Home Medications:  Medications Prior to Admission  Medication Sig Dispense Refill Last Dose   acetaminophen (TYLENOL) 500 MG tablet Take 1,000 mg by mouth every 6 (six) hours as needed for mild pain, fever or headache.   09/04/2020 at 0850   acidophilus (RISAQUAD) CAPS capsule Take 1 capsule by mouth daily.   Past Month   aspirin-acetaminophen-caffeine (EXCEDRIN MIGRAINE) 250-250-65 MG tablet Take 2 tablets by mouth every 6 (six) hours as needed for headache.   Past Week   cephALEXin (KEFLEX) 500 MG capsule Take 500 mg by mouth in the morning and at bedtime. X 10 days started on 08/21/20 then 250 mg daily after that   09/04/2020 at  0700   fluconazole (DIFLUCAN) 150 MG tablet Take 150 mg by mouth daily. Once a week for 2 weeks started on 08/21/20   Past Week   Meth-Hyo-M Bl-Na Phos-Ph Sal (URIBEL PO) Take by mouth.   09/04/2020   tamsulosin (FLOMAX) 0.4 MG CAPS capsule Take 1 capsule (0.4 mg total) by mouth daily. 30 capsule 1 09/04/2020 at 0700   HYDROcodone-acetaminophen (NORCO/VICODIN) 5-325 MG tablet Take 1 tablet by mouth every 6 (six) hours as needed for moderate pain.   08/24/2020   ketorolac (TORADOL) 10 MG tablet Take 10 mg by mouth every 6 (six) hours as needed.   08/24/2020   ondansetron (ZOFRAN) 4 MG tablet Take 4 mg by mouth every 8 (eight) hours as needed for nausea or vomiting.      triamcinolone cream (KENALOG) 0.1 % Apply 1 application topically daily as needed (eczema).   More than a month   Allergies: No Known Allergies  Family History  Problem Relation Age of Onset   Breast cancer Maternal Grandmother    Breast cancer Paternal Grandmother    Social History:  reports that she has quit smoking. Her smoking use included cigarettes. She has never used smokeless tobacco. She reports current alcohol use. She reports that she does not use drugs.  ROS: A complete review of systems was performed.  All systems are negative except for pertinent findings as noted. ROS   Physical Exam:  Vital signs in last 24 hours: Temp:  [97.9 F (36.6 C)] 97.9 F (36.6 C) (07/25 1130) Pulse  Rate:  [67-86] 67 (07/25 1130) Resp:  [11-21] 12 (07/25 1130) BP: (94-131)/(59-91) 110/80 (07/25 1130) SpO2:  [95 %-100 %] 95 % (07/25 1130) General:  Alert and oriented, No acute distress HEENT: Normocephalic, atraumatic Neck: No JVD or lymphadenopathy Cardiovascular: Regular rate and rhythm Lungs: Regular rate and effort Abdomen: Soft, nontender, nondistended, no abdominal masses Back: No CVA tenderness Extremities: No edema Neurologic: Grossly intact  Laboratory Data:  Results for orders placed or performed during the  hospital encounter of 09/04/20 (from the past 24 hour(s))  ABO/Rh     Status: None   Collection Time: 09/04/20  8:35 AM  Result Value Ref Range   ABO/RH(D)      A POS Performed at [facility], [address] [place], [place] [address]    Recent Results (from the past 240 hour(s))  SARS CORONAVIRUS 2 (TAT 6-24 HRS) Nasopharyngeal Nasopharyngeal Swab     Status: None   Collection Time: 08/31/20 12:04 PM   Specimen: Nasopharyngeal Swab  Result Value Ref Range Status   SARS Coronavirus 2 NEGATIVE NEGATIVE Final    Comment: (NOTE) SARS-CoV-2 target nucleic acids are NOT DETECTED.  The SARS-CoV-2 RNA is generally detectable in upper and lower respiratory specimens during the acute phase of infection. Negative results do not preclude SARS-CoV-2 infection, do not rule out co-infections with other pathogens, and should not be used as the sole basis for treatment or other patient management decisions. Negative results must be combined with clinical observations, patient history, and epidemiological information. The expected result is Negative.  Fact Sheet for Patients: HairSlick.no  Fact Sheet for Healthcare Providers: quierodirigir.com  This test is not yet approved or cleared by the [place] FDA and  has been authorized for detection and/or diagnosis of SARS-CoV-2 by FDA under an Emergency Use Authorization (EUA). This EUA will remain  in effect (meaning this test can be used) for the duration of the COVID-19 declaration under Se ction 564(b)(1) of the Act, 21 U.S.C. section 360bbb-3(b)(1), unless the authorization is terminated or revoked sooner.  Performed at [facility], [address] [place], [place] [address]    Creatinine: No results for input(s): CREATININE in the last 168 hours.  Impression/Assessment:  Bilateral renal calculi, left ureteral calculus  Plan:  Proceed with left  PCNL.  Risk of bleeding requiring blood transfusion, infection, injury to surrounding structures, need for additional procedures discussed as well as possible need for embolization for bleeding, and rare kidney loss.  Ray Church, III 09/04/2020, 12:16 PM

## 2020-09-04 NOTE — Plan of Care (Signed)

## 2020-09-04 NOTE — Transfer of Care (Signed)
Immediate Anesthesia Transfer of Care Note  Patient: Kathryn Crawford  Procedure(s) Performed: LEFT NEPHROLITHOTOMY PERCUTANEOUS POSSIBLE URETEROSCOPY WITH STENT PLACEMENT (Left: Flank)  Patient Location: PACU  Anesthesia Type:General  Level of Consciousness: awake, alert  and oriented  Airway & Oxygen Therapy: Patient Spontanous Breathing and Patient connected to face mask  Post-op Assessment: Report given to RN and Post -op Vital signs reviewed and stable  Post vital signs: Reviewed and stable  Last Vitals:  Vitals Value Taken Time  BP 120/69 09/04/20 1502  Temp    Pulse 85 09/04/20 1504  Resp 18 09/04/20 1504  SpO2 100 % 09/04/20 1504  Vitals shown include unvalidated device data.  Last Pain:  Vitals:   09/04/20 0822  TempSrc: Oral  PainSc: 1       Patients Stated Pain Goal: 3 (09/04/20 1610)  Complications: No notable events documented.

## 2020-09-04 NOTE — Discharge Instructions (Signed)
Discharge instructions following PCNL ° °Call your doctor for: °Fevers greater than 100.5 °Severe nausea or vomiting °Increasing pain not controlled by pain medication °Increasing redness or drainage from incisions °Decreased urine output or a catheter is no longer draining ° °The number for questions is [phone]. ° °Activity: °Gradually increase activity with short frequent walks, 3-4 times a day.  Avoid strenuous activities, like sports, lawn-mowing, or heavy lifting (more than 10-15 pounds).  Wear loose, comfortable clothing that pull or kink the tube or tubes.  Do not drive while taking pain medication, or until your doctor permitts it. ° °Bathing and dressing changes: °You should not shower for 48 hours after surgery.  Do not soak your back in a bathtub. ° °Drainage bag care: °You may be discharged with a drainage bag around the site of your surgery.  The drainage bag should be secured such that it never pulls or loosens to prevent it from leaking.  It is important to wash her hands before and after emptying the drainage bag to help prevent the spread of infection.  The drainage bag should be emptied as needed.  When the wound stops draining or it is manageable with a dry gauze dressing, you can remove the bag. ° °If your tube in the back was removed, you should expect to have some leakage of fluid from the back incision.  This should slowly decrease and stop over the next couple of days.  If you have severe pain or persistent leakage, please call the number above.  Otherwise, your dressing can be changed 1-2 times daily or more if needed. ° °Diet: °It is extremely important to drink plenty of fluids after surgery, especially water.  You may resume your regular diet, unless otherwise instructed. ° °Medications: °May take Tylenol (acetaminophen) or ibuprofen (Advil, Motrin) as directed over-the-counter. °Take any prescriptions as directed. ° °Follow-up appointments: °Follow-up appointment will be scheduled  with Dr. Nylia Gavina °

## 2020-09-04 NOTE — Anesthesia Postprocedure Evaluation (Signed)
Anesthesia Post Note  Patient: Kathryn Crawford  Procedure(s) Performed: LEFT NEPHROLITHOTOMY PERCUTANEOUS POSSIBLE URETEROSCOPY WITH STENT PLACEMENT (Left: Flank)     Patient location during evaluation: PACU Anesthesia Type: General Level of consciousness: patient cooperative, sedated and oriented Pain management: pain level controlled Vital Signs Assessment: post-procedure vital signs reviewed and stable Respiratory status: spontaneous breathing, nonlabored ventilation and respiratory function stable Cardiovascular status: blood pressure returned to baseline and stable Postop Assessment: no apparent nausea or vomiting Anesthetic complications: no   No notable events documented.  Last Vitals:  Vitals:   09/04/20 1615 09/04/20 1630  BP: (!) 108/48 93/64  Pulse: 85 79  Resp: 18 14  Temp:  36.6 C  SpO2: 95% 95%    Last Pain:  Vitals:   09/04/20 1600  TempSrc:   PainSc: 7                  Kathryn Crawford,E. Shayanne Gomm

## 2020-09-04 NOTE — Consult Note (Addendum)
Chief Complaint: Patient was seen in consultation today for left percutaneous nephrostomy/nephroureteral catheter placement   Referring Physician(s): Bell,E  Supervising Physician: Milford Cage, J   Patient Status: [facility] - Out-pt  History of Present Illness: Kathryn Crawford is a 40 y.o. female with history of nephrolithiasis who presented in May of this year with fever and left-sided flank pain.  She was found to have a left ureteral calculus and bilateral renal calculi with a large staghorn calculus on the left.  She underwent left ureteral stent placement on 07/06/2020.  She presents today for left percutaneous nephrostomy/nephroureteral catheter placement prior to nephrolithotomy.  Additional medical history as below.    Past Medical History:  Diagnosis Date   Anemia    Anxiety    History of kidney stones    Pulmonary embolism [facility])     Past Surgical History:  Procedure Laterality Date   BREAST ENHANCEMENT SURGERY  2008   CYSTOSCOPY WITH RETROGRADE PYELOGRAM, URETEROSCOPY AND STENT PLACEMENT Left 07/06/2020   Procedure: CYSTOSCOPY WITH RETROGRADE PYELOGRAM, URETEROSCOPY AND STENT PLACEMENT;  Surgeon: Crista Elliot, MD;  Location: [facility];  Service: Urology;  Laterality: Left;   CYSTOSCOPY WITH STENT PLACEMENT Left 09/19/2017   Procedure: CYSTOSCOPY WITH STENT PLACEMENT;  Surgeon: Ihor Gully, MD;  Location: [facility];  Service: Urology;  Laterality: Left;   EXTRACORPOREAL SHOCK WAVE LITHOTRIPSY Left 10/16/2017   Procedure: LEFT EXTRACORPOREAL SHOCK WAVE LITHOTRIPSY (ESWL);  Surgeon: Ihor Gully, MD;  Location: [facility];  Service: Urology;  Laterality: Left;   TUBAL LIGATION Bilateral     Allergies: Patient has no known allergies.  Medications: Prior to Admission medications   Medication Sig Start Date End Date Taking? Authorizing Provider  acetaminophen (TYLENOL) 500 MG tablet Take 1,000 mg by mouth every 6 (six) hours as needed for mild pain, fever or headache.    Yes [provider]  acidophilus (RISAQUAD) CAPS capsule Take 1 capsule by mouth daily.   Yes [provider]  aspirin-acetaminophen-caffeine (EXCEDRIN MIGRAINE) [phone] MG tablet Take 2 tablets by mouth every 6 (six) hours as needed for headache.   Yes [provider]  tamsulosin (FLOMAX) 0.4 MG CAPS capsule Take 1 capsule (0.4 mg total) by mouth daily. 07/07/20  Yes Standley Brooking, MD  triamcinolone cream (KENALOG) 0.1 % Apply 1 application topically daily as needed (eczema).   Yes [provider]  cephALEXin (KEFLEX) 500 MG capsule Take 500 mg by mouth in the morning and at bedtime. X 10 days started on 08/21/20 then 250 mg daily after that    [provider]  fluconazole (DIFLUCAN) 150 MG tablet Take 150 mg by mouth daily. Once a week for 2 weeks started on 08/21/20    [provider]  HYDROcodone-acetaminophen (NORCO/VICODIN) 5-325 MG tablet Take 1 tablet by mouth every 6 (six) hours as needed for moderate pain.    [provider]  ketorolac (TORADOL) 10 MG tablet Take 10 mg by mouth every 6 (six) hours as needed.    [provider]  ondansetron (ZOFRAN) 4 MG tablet Take 4 mg by mouth every 8 (eight) hours as needed for nausea or vomiting.    [provider]     Family History  Problem Relation Age of Onset   Breast cancer Maternal Grandmother    Breast cancer Paternal Grandmother     Social History   Socioeconomic History   Marital status: Married    Spouse name: Not on file   Number of children:  Not on file   Years of education: Not on file   Highest education level: Not on file  Occupational History   Not on file  Tobacco Use   Smoking status: Former    Types: Cigarettes   Smokeless tobacco: Never  Vaping Use   Vaping Use: Never used  Substance and Sexual Activity   Alcohol use: Yes   Drug use: No   Sexual activity: Not on file  Other Topics Concern   Not on file  Social History  Narrative   Not on file   Social Determinants of Health   Financial Resource Strain: Not on file  Food Insecurity: Not on file  Transportation Needs: Not on file  Physical Activity: Not on file  Stress: Not on file  Social Connections: Not on file      Review of Systems currently denies fever, headache, chest pain, dyspnea, cough, nausea, vomiting or bleeding; she has had prior left flank pain, some occasional left lower pelvic discomfort and intermittent dysuria.  Vital Signs: Vitals:   09/04/20 0822  BP: 124/84  Pulse: 81  SpO2: 100%      Physical Exam awake, alert.  Chest clear to auscultation bilaterally.  Heart with regular rate and rhythm.  Abdomen soft, positive bowel sounds, nontender.  No lower extremity edema.  Imaging: No results found.  Labs:  CBC: Recent Labs    07/05/20 2355 08/22/20 0959  WBC 8.9 6.5  HGB 12.0 11.1*  HCT 37.0 35.1*  PLT 282 352    COAGS: Recent Labs    08/22/20 0959  INR 1.0    BMP: Recent Labs    07/05/20 2355 08/22/20 0959  NA 137 137  K 3.9 3.6  CL 103 103  CO2 25 25  GLUCOSE 153* 119*  BUN 16 24*  CALCIUM 9.7 9.8  CREATININE 1.00 1.17*  GFRNONAA >60 >60    LIVER FUNCTION TESTS: Recent Labs    07/05/20 2355  BILITOT 0.2*  AST 40  ALT 31  ALKPHOS 75  PROT 7.4  ALBUMIN 3.6    TUMOR MARKERS: No results for input(s): AFPTM, CEA, CA199, CHROMGRNA in the last 8760 hours.  Assessment and Plan: 40 y.o. female with history of nephrolithiasis who presented in May of this year with fever and left-sided flank pain.  She was found to have a left ureteral calculus and bilateral renal calculi with a large staghorn calculus on the left.  She underwent left ureteral stent placement on 07/06/2020.  She presents today for left percutaneous nephrostomy/nephroureteral catheter placement prior to nephrolithotomy.Risks and benefits of left PCN placement was discussed with the patient including, but not limited to,  infection, bleeding, significant bleeding causing loss or decrease in renal function or damage to adjacent structures.   All of the patient's questions were answered, patient is agreeable to proceed.  Consent signed and in chart.      Thank you for this interesting consult.  I greatly enjoyed meeting Joselyne Spake and look forward to participating in their care.  A copy of this report was sent to the requesting provider on this date.  Electronically Signed: D. Jeananne Rama, PA-C 09/04/2020, 8:19 AM   I spent a total of  25 minutes  in face to face in clinical consultation, greater than 50% of which was counseling/coordinating care for left percutaneous nephrostomy/nephroureteral cath placement

## 2020-09-04 NOTE — Anesthesia Procedure Notes (Signed)
Procedure Name: Intubation Date/Time: 09/04/2020 12:49 PM Performed by: Niel Hummer, CRNA Pre-anesthesia Checklist: Patient identified, Emergency Drugs available, Suction available and Patient being monitored Patient Re-evaluated:Patient Re-evaluated prior to induction Oxygen Delivery Method: Circle system utilized Preoxygenation: Pre-oxygenation with 100% oxygen Induction Type: IV induction Ventilation: Mask ventilation without difficulty Laryngoscope Size: Mac and 4 Grade View: Grade I Tube type: Oral Tube size: 7.0 mm Number of attempts: 1 Airway Equipment and Method: Stylet Placement Confirmation: ETT inserted through vocal cords under direct vision, positive ETCO2 and breath sounds checked- equal and bilateral Secured at: 22 cm Tube secured with: Tape Dental Injury: Teeth and Oropharynx as per pre-operative assessment

## 2020-09-04 NOTE — Anesthesia Preprocedure Evaluation (Signed)
Anesthesia Evaluation  Patient identified by MRN, date of birth, ID band Patient awake    Reviewed: Allergy & Precautions, NPO status , Patient's Chart, lab work & pertinent test results  Airway Mallampati: II  TM Distance: >3 FB Neck ROM: Full    Dental  (+) Dental Advisory Given   Pulmonary former smoker,    breath sounds clear to auscultation       Cardiovascular negative cardio ROS   Rhythm:Regular Rate:Normal     Neuro/Psych negative neurological ROS     GI/Hepatic negative GI ROS, Neg liver ROS,   Endo/Other  negative endocrine ROS  Renal/GU Renal InsufficiencyRenal disease     Musculoskeletal   Abdominal   Peds  Hematology  (+) anemia ,   Anesthesia Other Findings   Reproductive/Obstetrics                             Anesthesia Physical Anesthesia Plan  ASA: 2  Anesthesia Plan: General   Post-op Pain Management:    Induction: Intravenous  PONV Risk Score and Plan: 3 and Dexamethasone, Ondansetron, Midazolam and Treatment may vary due to age or medical condition  Airway Management Planned: Oral ETT  Additional Equipment:   Intra-op Plan:   Post-operative Plan: Extubation in OR  Informed Consent: I have reviewed the patients History and Physical, chart, labs and discussed the procedure including the risks, benefits and alternatives for the proposed anesthesia with the patient or authorized representative who has indicated his/her understanding and acceptance.     Dental advisory given  Plan Discussed with: CRNA  Anesthesia Plan Comments:         Anesthesia Quick Evaluation

## 2020-09-05 ENCOUNTER — Observation Stay (HOSPITAL_COMMUNITY): Payer: No Typology Code available for payment source

## 2020-09-05 ENCOUNTER — Encounter (HOSPITAL_COMMUNITY): Payer: Self-pay | Admitting: Urology

## 2020-09-05 DIAGNOSIS — N179 Acute kidney failure, unspecified: Secondary | ICD-10-CM | POA: Diagnosis present

## 2020-09-05 DIAGNOSIS — T8144XA Sepsis following a procedure, initial encounter: Secondary | ICD-10-CM | POA: Diagnosis present

## 2020-09-05 DIAGNOSIS — E872 Acidosis: Secondary | ICD-10-CM | POA: Diagnosis present

## 2020-09-05 DIAGNOSIS — N202 Calculus of kidney with calculus of ureter: Secondary | ICD-10-CM | POA: Diagnosis present

## 2020-09-05 DIAGNOSIS — A414 Sepsis due to anaerobes: Secondary | ICD-10-CM | POA: Diagnosis present

## 2020-09-05 DIAGNOSIS — Z87891 Personal history of nicotine dependence: Secondary | ICD-10-CM | POA: Diagnosis not present

## 2020-09-05 DIAGNOSIS — Z86711 Personal history of pulmonary embolism: Secondary | ICD-10-CM | POA: Diagnosis not present

## 2020-09-05 DIAGNOSIS — N390 Urinary tract infection, site not specified: Secondary | ICD-10-CM | POA: Diagnosis present

## 2020-09-05 DIAGNOSIS — Y831 Surgical operation with implant of artificial internal device as the cause of abnormal reaction of the patient, or of later complication, without mention of misadventure at the time of the procedure: Secondary | ICD-10-CM | POA: Diagnosis present

## 2020-09-05 DIAGNOSIS — Z803 Family history of malignant neoplasm of breast: Secondary | ICD-10-CM | POA: Diagnosis not present

## 2020-09-05 LAB — CBC
HCT: 30.7 % — ABNORMAL LOW (ref 36.0–46.0)
Hemoglobin: 9.8 g/dL — ABNORMAL LOW (ref 12.0–15.0)
MCH: 29.3 pg (ref 26.0–34.0)
MCHC: 31.9 g/dL (ref 30.0–36.0)
MCV: 91.9 fL (ref 80.0–100.0)
Platelets: 237 10*3/uL (ref 150–400)
RBC: 3.34 MIL/uL — ABNORMAL LOW (ref 3.87–5.11)
RDW: 14.3 % (ref 11.5–15.5)
WBC: 12.6 10*3/uL — ABNORMAL HIGH (ref 4.0–10.5)
nRBC: 0 % (ref 0.0–0.2)

## 2020-09-05 LAB — URINALYSIS, ROUTINE W REFLEX MICROSCOPIC
Bilirubin Urine: NEGATIVE
Glucose, UA: NEGATIVE mg/dL
Ketones, ur: NEGATIVE mg/dL
Nitrite: NEGATIVE
Protein, ur: 30 mg/dL — AB
RBC / HPF: >50 RBC/hpf — ABNORMAL HIGH (ref 0–5)
Specific Gravity, Urine: 1.01 (ref 1.005–1.030)
WBC, UA: >50 WBC/hpf — ABNORMAL HIGH (ref 0–5)
pH: 6 (ref 5.0–8.0)

## 2020-09-05 LAB — BASIC METABOLIC PANEL
Anion gap: 10 (ref 5–15)
BUN: 24 mg/dL — ABNORMAL HIGH (ref 6–20)
CO2: 24 mmol/L (ref 22–32)
Calcium: 9.4 mg/dL (ref 8.9–10.3)
Chloride: 103 mmol/L (ref 98–111)
Creatinine, Ser: 1.29 mg/dL — ABNORMAL HIGH (ref 0.44–1.00)
GFR, Estimated: 53 mL/min — ABNORMAL LOW (ref >60)
Glucose, Bld: 126 mg/dL — ABNORMAL HIGH (ref 70–99)
Potassium: 4.1 mmol/L (ref 3.5–5.1)
Sodium: 137 mmol/L (ref 135–145)

## 2020-09-05 LAB — LACTIC ACID, PLASMA
Lactic Acid, Venous: 2.1 mmol/L (ref 0.5–1.9)
Lactic Acid, Venous: 2.4 mmol/L (ref 0.5–1.9)

## 2020-09-05 LAB — COMPREHENSIVE METABOLIC PANEL
ALT: 12 U/L (ref 0–44)
AST: 21 U/L (ref 15–41)
Albumin: 3.2 g/dL — ABNORMAL LOW (ref 3.5–5.0)
Alkaline Phosphatase: 55 U/L (ref 38–126)
Anion gap: 11 (ref 5–15)
BUN: 17 mg/dL (ref 6–20)
CO2: 25 mmol/L (ref 22–32)
Calcium: 9.2 mg/dL (ref 8.9–10.3)
Chloride: 100 mmol/L (ref 98–111)
Creatinine, Ser: 1.04 mg/dL — ABNORMAL HIGH (ref 0.44–1.00)
GFR, Estimated: >60 mL/min (ref >60)
Glucose, Bld: 129 mg/dL — ABNORMAL HIGH (ref 70–99)
Potassium: 3.7 mmol/L (ref 3.5–5.1)
Sodium: 136 mmol/L (ref 135–145)
Total Bilirubin: 0.5 mg/dL (ref 0.3–1.2)
Total Protein: 6.4 g/dL — ABNORMAL LOW (ref 6.5–8.1)

## 2020-09-05 LAB — HEMOGLOBIN AND HEMATOCRIT, BLOOD
HCT: 33.4 % — ABNORMAL LOW (ref 36.0–46.0)
Hemoglobin: 10.7 g/dL — ABNORMAL LOW (ref 12.0–15.0)

## 2020-09-05 LAB — MRSA NEXT GEN BY PCR, NASAL: MRSA by PCR Next Gen: NOT DETECTED

## 2020-09-05 LAB — LACTATE DEHYDROGENASE: LDH: 137 U/L (ref 98–192)

## 2020-09-05 MED ORDER — SODIUM CHLORIDE 0.9 % IV BOLUS
500.0000 mL | Freq: Once | INTRAVENOUS | Status: AC
  Administered 2020-09-05: 500 mL via INTRAVENOUS

## 2020-09-05 MED ORDER — POLYETHYLENE GLYCOL 3350 17 G PO PACK
17.0000 g | PACK | Freq: Every day | ORAL | Status: DC
  Administered 2020-09-05 – 2020-09-08 (×4): 17 g via ORAL
  Filled 2020-09-05 (×4): qty 1

## 2020-09-05 MED ORDER — CEPHALEXIN 500 MG PO CAPS
500.0000 mg | ORAL_CAPSULE | Freq: Three times a day (TID) | ORAL | Status: DC
  Administered 2020-09-05: 500 mg via ORAL
  Filled 2020-09-05: qty 1

## 2020-09-05 MED ORDER — FLUCONAZOLE 100 MG PO TABS
200.0000 mg | ORAL_TABLET | Freq: Once | ORAL | Status: AC
  Administered 2020-09-05: 200 mg via ORAL
  Filled 2020-09-05: qty 2

## 2020-09-05 MED ORDER — SODIUM CHLORIDE 0.9 % IV SOLN
2.0000 g | INTRAVENOUS | Status: DC
  Administered 2020-09-05 – 2020-09-06 (×2): 2 g via INTRAVENOUS
  Filled 2020-09-05: qty 2
  Filled 2020-09-05: qty 20
  Filled 2020-09-05: qty 2
  Filled 2020-09-05: qty 20

## 2020-09-05 NOTE — Sepsis Progress Note (Signed)
eLink is following this Code Sepsis. °

## 2020-09-05 NOTE — Significant Event (Signed)
Rapid Response Event Note   Reason for Call : MEWS (RED/4)-elevated due to Fever, Tachycardia, and Respirations  Fever has been ongoing and being treated since admission. Patient was diagnosed with left renal calculus. A left percutaneous nephrolithotomy, left antegrade ureteroscopy with laser lithotripsy, antegrade ureteral stent removal and replacement completed on 7/25.  Discussed with bedside RN, Trula Ore, in discussing with Urology in initiating Code sepsis due to ongoing fever and elevated MEWS score.   Initial Focused Assessment:  Neuro: Alert and oriented x 4, able to follow commands with ease. Complaining of moderate to severe pain in left flank to left quadrant. Cardiac: HR-ST, BP systolic 110s-130s-see vital signs, s1 and s2 heard upon auscultation Pulmonary: Room air, O2 Sats WNL, RR 20-22, breath sounds clear in upper lung fields, clear and diminished in lower lung fields.  Skin:  The incision site is red around the site spreading toward the left quadrant. The site is painful upon palpation. Appears to be inflamed. Skin marker applied to monitor for possible spread.     Interventions:  Code Sepsis initiated, Pola Corn notified  MD Bell paged by bedside RN, Trula Ore, due to surgical site being red, inflamed, and painful at the touch.  Skin marker applied   Plan of Care:  Placed patient on cardiac telemetry, patient can remain on 4th floor progressive/urology for now. Continue to monitor vital signs per code sepsis protocol. If patient has a hemodynamic change, or change in mental status, or other concerns please call Rapid Response at [phone].    Event Summary:   MD Notified: approximately at 1500 per bedside RN Call Time: 1515 Arrival Time: 1520 End Time: 1545  Horris Speros C, RN

## 2020-09-05 NOTE — Progress Notes (Signed)
Urology Inpatient Progress Report  Renal calculi [N20.0]  Procedure(s): LEFT NEPHROLITHOTOMY PERCUTANEOUS POSSIBLE URETEROSCOPY WITH STENT PLACEMENT  1 Day Post-Op   Intv/Subj: Patient was febrile today and tachycardic.  Is having some left-sided flank pain.  Has some erythema around the incision site.  Has some mild tenderness overlying that.  Chest x-ray showed some atelectasis.  She is ambulating well.  She denies any chest pain or shortness of breath.  Normal oxygen saturation.  No calf pain.   Active Problems:   Renal calculi  Current Facility-Administered Medications  Medication Dose Route Frequency Provider Last Rate Last Admin   0.9 %  sodium chloride infusion   Intravenous Continuous Ray Church III, MD 75 mL/hr at 09/05/20 1546 Restarted at 09/05/20 1546   acetaminophen (TYLENOL) tablet 650 mg  650 mg Oral Q4H PRN Ray Church III, MD   650 mg at 09/05/20 1434   belladonna-opium (B&O) suppository 16.2-60mg   1 suppository Rectal Q6H PRN Ray Church III, MD       cefTRIAXone (ROCEPHIN) 2 g in sodium chloride 0.9 % 100 mL IVPB  2 g Intravenous Q24H Ray Church III, MD       Chlorhexidine Gluconate Cloth 2 % PADS 6 each  6 each Topical Daily Ray Church III, MD   6 each at 09/05/20 1702   diphenhydrAMINE (BENADRYL) injection 12.5-25 mg  12.5-25 mg Intravenous Q6H PRN Ray Church III, MD       Or   diphenhydrAMINE (BENADRYL) 12.5 MG/5ML elixir 12.5-25 mg  12.5-25 mg Oral Q6H PRN Ray Church III, MD       docusate sodium (COLACE) capsule 100 mg  100 mg Oral BID Ray Church III, MD   100 mg at 09/05/20 1115   fluconazole (DIFLUCAN) tablet 200 mg  200 mg Oral Once Crista Elliot, MD       HYDROcodone-acetaminophen (NORCO/VICODIN) 5-325 MG per tablet 1 tablet  1 tablet Oral Q4H PRN Ray Church III, MD   1 tablet at 09/05/20 6222   HYDROmorphone (DILAUDID) injection 0.5 mg  0.5 mg Intravenous Q3H PRN Ray Church III, MD   0.5 mg at 09/05/20 1330    neomycin-bacitracin-polymyxin (NEOSPORIN) ointment packet 1 application  1 application Topical TID PRN Ray Church III, MD       ondansetron [facility]) injection 4 mg  4 mg Intravenous Q4H PRN Ray Church III, MD   4 mg at 09/05/20 0830   oxybutynin (DITROPAN) tablet 5 mg  5 mg Oral Q8H PRN Crista Elliot, MD       senna (SENOKOT) tablet 8.6 mg  1 tablet Oral BID Ray Church III, MD   8.6 mg at 09/05/20 1115   tamsulosin (FLOMAX) capsule 0.4 mg  0.4 mg Oral Daily Ray Church III, MD   0.4 mg at 09/05/20 1115   zolpidem (AMBIEN) tablet 5 mg  5 mg Oral QHS PRN Ray Church III, MD         Objective: Vital: Vitals:   09/05/20 0054 09/05/20 0531 09/05/20 1355 09/05/20 1548  BP: 117/75 118/71 136/82 125/76  Pulse: (!) 114 (!) 115 (!) 117 (!) 112  Resp: 16 17 (!) 22 (!) 22  Temp: 99.6 F (37.6 C) 100.2 F (37.9 C) (!) 101.1 F (38.4 C) 98.6 F (37 C)  TempSrc:  Oral    SpO2: 97% 98% 96% 97%  Weight:      Height:  I/Os: I/O last 3 completed shifts: In: 891 [I.V.:891] Out: 1425 [Urine:1400; Blood:25]  Physical Exam:  General: Patient is in no apparent distress Lungs: Normal respiratory effort, chest expands symmetrically. GI: The abdomen is soft and nontender without mass. Back: Incision with dressing over it.  She has some surrounding erythema that was marked.  Mild tenderness overlying this. Ext: lower extremities symmetric  Lab Results: Recent Labs    09/04/20 0827 09/04/20 1611 09/05/20 0400  WBC 8.9  --   --   HGB 11.8* 11.7* 10.7*  HCT 36.4 36.6 33.4*   Recent Labs    09/04/20 1611 09/05/20 0400  NA 139 137  K 4.1 4.1  CL 107 103  CO2 26 24  GLUCOSE 118* 126*  BUN 27* 24*  CREATININE 1.13* 1.29*  CALCIUM 9.6 9.4   Recent Labs    09/04/20 0827  INR 1.0   No results for input(s): LABURIN in the last 72 hours. Results for orders placed or performed during the hospital encounter of 08/31/20  SARS CORONAVIRUS 2 (TAT 6-24 HRS)  Nasopharyngeal Nasopharyngeal Swab     Status: None   Collection Time: 08/31/20 12:04 PM   Specimen: Nasopharyngeal Swab  Result Value Ref Range Status   SARS Coronavirus 2 NEGATIVE NEGATIVE Final    Comment: (NOTE) SARS-CoV-2 target nucleic acids are NOT DETECTED.  The SARS-CoV-2 RNA is generally detectable in upper and lower respiratory specimens during the acute phase of infection. Negative results do not preclude SARS-CoV-2 infection, do not rule out co-infections with other pathogens, and should not be used as the sole basis for treatment or other patient management decisions. Negative results must be combined with clinical observations, patient history, and epidemiological information. The expected result is Negative.  Fact Sheet for Patients: HairSlick.no  Fact Sheet for Healthcare Providers: quierodirigir.com  This test is not yet approved or cleared by the [place] FDA and  has been authorized for detection and/or diagnosis of SARS-CoV-2 by FDA under an Emergency Use Authorization (EUA). This EUA will remain  in effect (meaning this test can be used) for the duration of the COVID-19 declaration under Se ction 564(b)(1) of the Act, 21 U.S.C. section 360bbb-3(b)(1), unless the authorization is terminated or revoked sooner.  Performed at [facility], [address], [place], [place] [address]     Studies/Results: DG Chest 1 View  Result Date: 09/05/2020 CLINICAL DATA:  40 year old female with fever. EXAM: CHEST  1 VIEW COMPARISON:  Chest radiograph dated 08/28/2018. FINDINGS: Bibasilar platelike and streaky atelectasis. Developing infiltrate is less likely but not excluded clinical correlation is recommended. No lobar consolidation, pleural effusion, or pneumothorax. The cardiac silhouette is within normal limits. No acute osseous pathology. IMPRESSION: Bibasilar atelectasis. Developing infiltrate is less  likely but not excluded. Electronically Signed   By: Elgie Collard M.D.   On: 09/05/2020 15:40   DG C-Arm 1-60 Min-No Report  Result Date: 09/04/2020 Fluoroscopy was utilized by the requesting physician.  No radiographic interpretation.   IR URETERAL STENT LEFT NEW ACCESS W/O SEP NEPHROSTOMY CATH  Result Date: 09/04/2020 INDICATION: 40 year old female with left nephrolithiasis, recommended for percutaneous nephrolithotomy with urology EXAM: ULTRASOUND AND FLUOROSCOPIC GUIDED PLACEMENT OF LEFT NEPHROURETERAL CATHETER COMPARISON:  CT and pelvis, 07/06/2020. Intra procedural fluoroscopy, 07/06/2020. MEDICATIONS: Ancef 2 gm IV; The antibiotic was administered in an appropriate time frame prior to skin puncture. ANESTHESIA/SEDATION: Moderate (conscious) sedation was employed during this procedure. A total of Versed 4 mg and Fentanyl 200 mcg was administered  intravenously. Moderate Sedation Time: 41 minutes. The patient's level of consciousness and vital signs were monitored continuously by radiology nursing throughout the procedure under my direct supervision. CONTRAST:  10 mL Isovue 300 - administered into the renal collecting system FLUOROSCOPY TIME:  Five minutes 48 seconds. COMPLICATIONS: None immediate. PROCEDURE: The procedure, risks, benefits, and alternatives were explained to the patient, questions were encouraged and answered and informed consent was obtained. A timeout was performed prior to the initiation of the procedure. The operative site was prepped and draped in the usual sterile fashion and a sterile drape was applied covering the operative field. A sterile gown and sterile gloves were used for the procedure. Local anesthesia was provided with 1% Lidocaine with epinephrine. Ultrasound was used to localize the left kidney. Under direct ultrasound guidance, a 20 gauge needle was advanced into the renal collecting system. An ultrasound image documentation was performed. Access within the  collecting system was confirmed with the efflux of urine followed by limited contrast injection. For the procedure by the urologist, for percutaneous nephrolithotomy access, the lower left renal pole was targeted, then under direct ultrasound guidance, a 20 gauge needle was advanced into the renal collecting system. An ultrasound image documentation was performed. Access within the collecting system was confirmed with the efflux of urine followed by limited contrast injection. Under intermittent fluoroscopic guidance, an 0.018 wire was advanced into the collecting system and the tract was dilated with an Accustick transitional dilator system. A 4 French glide catheter over a 0.035 inch glidewire was used to get access past the ureter into the urinary bladder. This was exchanged for a Amplatz wire and ultimately a 5 [place] Kumpe the catheter, with tip into the urinary bladder. Contrast was injected to confirm location of placement. The catheter was secured at the skin entrance site with an interrupted suture. Dressings were applied. The patient tolerated procedure well without immediate postprocedural complication. FINDINGS: Ultrasound scanning demonstrates a moderate dilated left collecting system. Under a combination of ultrasound and fluoroscopic guidance, a posterior inferior calix was targeted, with stone burden and appropriate trajectory for percutaneous nephrolithotomy access, allowing placement of a 5-French percutaneous nephroureteral catheter with end coiled within the urinary bladder. Contrast injection confirmed appropriate positioning. IMPRESSION: Successful ultrasound and fluoroscopic guided placement of a left sided 5 French nephroureteral catheter, for percutaneous nephrolithotomy access. Electronically Signed   By: Roanna Banning MD   On: 09/04/2020 13:06    Assessment: Sepsis secondary to UTI Left renal and ureteral calculus  Procedure(s): LEFT NEPHROLITHOTOMY PERCUTANEOUS POSSIBLE URETEROSCOPY  WITH STENT PLACEMENT, 1 Day Post-Op  doing well.  Plan: Start ceftriaxone 1 g every 24 hours, sepsis alert called 500 cc bolus given, continue normal saline at 75/h Repeat CBC showed hemoglobin 9.8.  Continue to monitor for acute blood loss anemia.  Hold off on DVT prophylaxis given the decrease in hemoglobin.  Erythema around the incision may be a sign of hematoma.  Monitor.   Obtain urine culture and blood culture x2.  Chest x-ray already done Morning labs Incentive spirometry   Modena Slater, MD Urology 09/05/2020, 5:17 PM

## 2020-09-05 NOTE — Sepsis Progress Note (Signed)
Notified provider of need to order another repeat lactic acid, as the second was higher than the first.   

## 2020-09-05 NOTE — Progress Notes (Signed)
Patient temp, HR, and RR elevated, moving her into Red MEWS.  RN contacted MD, Rapid Response, and CN.  MD ordered sepsis protocol which RN has implemented.

## 2020-09-06 ENCOUNTER — Encounter (HOSPITAL_COMMUNITY): Payer: Self-pay | Admitting: Urology

## 2020-09-06 LAB — CBC
HCT: 30.6 % — ABNORMAL LOW (ref 36.0–46.0)
Hemoglobin: 9.7 g/dL — ABNORMAL LOW (ref 12.0–15.0)
MCH: 29.2 pg (ref 26.0–34.0)
MCHC: 31.7 g/dL (ref 30.0–36.0)
MCV: 92.2 fL (ref 80.0–100.0)
Platelets: 238 10*3/uL (ref 150–400)
RBC: 3.32 MIL/uL — ABNORMAL LOW (ref 3.87–5.11)
RDW: 14.3 % (ref 11.5–15.5)
WBC: 10.3 10*3/uL (ref 4.0–10.5)
nRBC: 0 % (ref 0.0–0.2)

## 2020-09-06 LAB — BASIC METABOLIC PANEL
Anion gap: 7 (ref 5–15)
BUN: 13 mg/dL (ref 6–20)
CO2: 29 mmol/L (ref 22–32)
Calcium: 9 mg/dL (ref 8.9–10.3)
Chloride: 102 mmol/L (ref 98–111)
Creatinine, Ser: 0.85 mg/dL (ref 0.44–1.00)
GFR, Estimated: >60 mL/min (ref >60)
Glucose, Bld: 97 mg/dL (ref 70–99)
Potassium: 4 mmol/L (ref 3.5–5.1)
Sodium: 138 mmol/L (ref 135–145)

## 2020-09-06 LAB — LACTIC ACID, PLASMA: Lactic Acid, Venous: 1.2 mmol/L (ref 0.5–1.9)

## 2020-09-06 MED ORDER — BISACODYL 10 MG RE SUPP
10.0000 mg | Freq: Every day | RECTAL | Status: DC | PRN
  Administered 2020-09-06: 10 mg via RECTAL
  Filled 2020-09-06: qty 1

## 2020-09-06 MED ORDER — ENOXAPARIN SODIUM 40 MG/0.4ML IJ SOSY
40.0000 mg | PREFILLED_SYRINGE | INTRAMUSCULAR | Status: DC
  Administered 2020-09-07 (×2): 40 mg via SUBCUTANEOUS
  Filled 2020-09-06 (×2): qty 0.4

## 2020-09-06 NOTE — Progress Notes (Signed)
Urology Inpatient Progress Report  Renal calculi [N20.0]  Procedure(s): LEFT NEPHROLITHOTOMY PERCUTANEOUS POSSIBLE URETEROSCOPY WITH STENT PLACEMENT  2 Days Post-Op   Intv/Subj: No acute events overnight. Remains with some tachycardia but improved. Good O2 sats on RA. Ambulating. Some fatigue but feeling improved overall compared to yesterday. Lactate normal   Active Problems:   Renal calculi  Current Facility-Administered Medications  Medication Dose Route Frequency Provider Last Rate Last Admin   0.9 %  sodium chloride infusion   Intravenous Continuous Ray Church III, MD 75 mL/hr at 09/06/20 0300 Infusion Verify at 09/06/20 0300   acetaminophen (TYLENOL) tablet 650 mg  650 mg Oral Q4H PRN Ray Church III, MD   650 mg at 09/05/20 1434   belladonna-opium (B&O) suppository 16.2-60mg   1 suppository Rectal Q6H PRN Ray Church III, MD       bisacodyl (DULCOLAX) suppository 10 mg  10 mg Rectal Daily PRN Ray Church III, MD   10 mg at 09/06/20 2040   cefTRIAXone (ROCEPHIN) 2 g in sodium chloride 0.9 % 100 mL IVPB  2 g Intravenous Q24H Ray Church III, MD 200 mL/hr at 09/06/20 1748 2 g at 09/06/20 1748   Chlorhexidine Gluconate Cloth 2 % PADS 6 each  6 each Topical Daily Ray Church III, MD   6 each at 09/05/20 1702   diphenhydrAMINE (BENADRYL) injection 12.5-25 mg  12.5-25 mg Intravenous Q6H PRN Ray Church III, MD       Or   diphenhydrAMINE (BENADRYL) 12.5 MG/5ML elixir 12.5-25 mg  12.5-25 mg Oral Q6H PRN Ray Church III, MD       docusate sodium (COLACE) capsule 100 mg  100 mg Oral BID Ray Church III, MD   100 mg at 09/06/20 2042   enoxaparin (LOVENOX) injection 40 mg  40 mg Subcutaneous Q24H Ray Church III, MD       HYDROcodone-acetaminophen (NORCO/VICODIN) 5-325 MG per tablet 1 tablet  1 tablet Oral Q4H PRN Ray Church III, MD   1 tablet at 09/06/20 0354   HYDROmorphone (DILAUDID) injection 0.5 mg  0.5 mg Intravenous Q3H PRN Ray Church III, MD   0.5  mg at 09/06/20 1308   neomycin-bacitracin-polymyxin (NEOSPORIN) ointment packet 1 application  1 application Topical TID PRN Ray Church III, MD       ondansetron [facility]) injection 4 mg  4 mg Intravenous Q4H PRN Ray Church III, MD   4 mg at 09/06/20 6578   oxybutynin (DITROPAN) tablet 5 mg  5 mg Oral Q8H PRN Ray Church III, MD   5 mg at 09/06/20 0551   polyethylene glycol (MIRALAX / GLYCOLAX) packet 17 g  17 g Oral Daily Ray Church III, MD   17 g at 09/06/20 0949   senna (SENOKOT) tablet 8.6 mg  1 tablet Oral BID Ray Church III, MD   8.6 mg at 09/06/20 2042   tamsulosin (FLOMAX) capsule 0.4 mg  0.4 mg Oral Daily Ray Church III, MD   0.4 mg at 09/06/20 0949   zolpidem (AMBIEN) tablet 5 mg  5 mg Oral QHS PRN Ray Church III, MD         Objective: Vital: Vitals:   09/06/20 0512 09/06/20 0946 09/06/20 1326 09/06/20 2031  BP: 124/73 130/79 124/80 122/78  Pulse: (!) 122 (!) 104 (!) 107 (!) 102  Resp: 16 18 20 16   Temp: 99.5 F (37.5 C) 99.4 F (37.4  C) 98.4 F (36.9 C) 99.1 F (37.3 C)  TempSrc: Oral     SpO2: 96% 95% 97% 97%  Weight:      Height:       I/Os: I/O last 3 completed shifts: In: 2637.9 [P.O.:600; I.V.:1433.8; IV Piggyback:604] Out: 5825 [Urine:5825]  Physical Exam:  General: Patient is in no apparent distress Lungs: Normal respiratory effort, chest expands symmetrically. GI: The abdomen is soft and nontender without mass. Back: dressing removed from incision. No drainage. Stable area of erythema ? Post-procedural vs cellulitis. Feels less tense and tender compared to yesterday Ext: lower extremities symmetric  Lab Results: Recent Labs    09/04/20 0827 09/04/20 1611 09/05/20 0400 09/05/20 1548 09/06/20 0341  WBC 8.9  --   --  12.6* 10.3  HGB 11.8*   < > 10.7* 9.8* 9.7*  HCT 36.4   < > 33.4* 30.7* 30.6*   < > = values in this interval not displayed.   Recent Labs    09/05/20 0400 09/05/20 1548 09/06/20 0341  NA 137 136 138  K  4.1 3.7 4.0  CL 103 100 102  CO2 24 25 29   GLUCOSE 126* 129* 97  BUN 24* 17 13  CREATININE 1.29* 1.04* 0.85  CALCIUM 9.4 9.2 9.0   Recent Labs    09/04/20 0827  INR 1.0   No results for input(s): LABURIN in the last 72 hours. Results for orders placed or performed during the hospital encounter of 09/04/20  MRSA Next Gen by PCR, Nasal     Status: None   Collection Time: 09/05/20 11:27 AM   Specimen: Nasal Mucosa; Nasal Swab  Result Value Ref Range Status   MRSA by PCR Next Gen NOT DETECTED NOT DETECTED Final    Comment: (NOTE) The GeneXpert MRSA Assay (FDA approved for NASAL specimens only), is one component of a comprehensive MRSA colonization surveillance program. It is not intended to diagnose MRSA infection nor to guide or monitor treatment for MRSA infections. Test performance is not FDA approved in patients less than 59 years old. Performed at [facility], [address] [place], [place]   Culture, blood (routine x 2)     Status: None (Preliminary result)   Collection Time: 09/05/20  3:42 PM   Specimen: BLOOD RIGHT HAND  Result Value Ref Range Status   Specimen Description   Final    BLOOD RIGHT HAND Performed at [facility], [address], [place], [place]    Special Requests   Final    BOTTLES DRAWN AEROBIC ONLY Blood Culture results may not be optimal due to an inadequate volume of blood received in culture bottles Performed at [facility], [address], [place], [place]    Culture   Final    NO GROWTH < 24 HOURS Performed at [facility], [address] [place], [place]    Report Status PENDING  Incomplete  Culture, blood (routine x 2)     Status: None (Preliminary result)   Collection Time: 09/05/20  3:47 PM   Specimen: BLOOD  Result Value Ref Range Status   Specimen Description   Final    BLOOD LEFT ANTECUBITAL Performed at [facility], [address] [place], [place]    Special Requests   Final    BOTTLES DRAWN AEROBIC AND ANAEROBIC Blood Culture adequate volume Performed at [facility] [place] [facility], [address] [place],  [place] [address]    Culture   Final    NO GROWTH < 24 HOURS Performed at [facility], [address], [place], [place] [address]    Report Status PENDING  Incomplete    Studies/Results: DG Chest 1 View  Result Date: 09/05/2020 CLINICAL DATA:  40 year old female with fever. EXAM: CHEST  1 VIEW COMPARISON:  Chest radiograph dated 08/28/2018. FINDINGS: Bibasilar platelike and streaky atelectasis. Developing infiltrate is less likely but not excluded clinical correlation is recommended. No lobar consolidation, pleural effusion, or pneumothorax. The cardiac silhouette is within normal limits. No acute osseous pathology. IMPRESSION: Bibasilar atelectasis. Developing infiltrate is less likely but not excluded. Electronically Signed   By: Elgie Collard M.D.   On: 09/05/2020 15:40    Assessment: Left renal and ureteral stone Post procedural SIRS, ? Sepsis secondary to UTI--Bcx NGTD  Procedure(s): LEFT NEPHROLITHOTOMY PERCUTANEOUS POSSIBLE URETEROSCOPY WITH STENT PLACEMENT, 2 Days Post-Op  doing well.  Plan: Continue ceftriaxone  Add lovenox DVT ppx, SCDs. Hgb has stabilized. Continue 75cc normal saline Ambulate, IS Am labs   Modena Slater, MD Urology 09/06/2020, 9:05 PM

## 2020-09-06 NOTE — Plan of Care (Signed)
  Problem: Education: Goal: Knowledge of General Education information will improve Description: Including pain rating scale, medication(s)/side effects and non-pharmacologic comfort measures Outcome: Progressing   Problem: Activity: Goal: Risk for activity intolerance will decrease Outcome: Progressing   Problem: Nutrition: Goal: Adequate nutrition will be maintained Outcome: Progressing   

## 2020-09-06 NOTE — Progress Notes (Signed)
Nurse was given report from [place], RN who had the patient from 1900-0300. Nurse agrees with the patient's current assessment and will continue to monitor the patient for any changes.

## 2020-09-07 LAB — BASIC METABOLIC PANEL
Anion gap: 6 (ref 5–15)
BUN: 12 mg/dL (ref 6–20)
CO2: 29 mmol/L (ref 22–32)
Calcium: 8.7 mg/dL — ABNORMAL LOW (ref 8.9–10.3)
Chloride: 103 mmol/L (ref 98–111)
Creatinine, Ser: 0.85 mg/dL (ref 0.44–1.00)
GFR, Estimated: >60 mL/min (ref >60)
Glucose, Bld: 98 mg/dL (ref 70–99)
Potassium: 4.5 mmol/L (ref 3.5–5.1)
Sodium: 138 mmol/L (ref 135–145)

## 2020-09-07 LAB — CBC
HCT: 30.8 % — ABNORMAL LOW (ref 36.0–46.0)
Hemoglobin: 9.7 g/dL — ABNORMAL LOW (ref 12.0–15.0)
MCH: 29 pg (ref 26.0–34.0)
MCHC: 31.5 g/dL (ref 30.0–36.0)
MCV: 92.2 fL (ref 80.0–100.0)
Platelets: 294 10*3/uL (ref 150–400)
RBC: 3.34 MIL/uL — ABNORMAL LOW (ref 3.87–5.11)
RDW: 14.2 % (ref 11.5–15.5)
WBC: 9.2 10*3/uL (ref 4.0–10.5)
nRBC: 0 % (ref 0.0–0.2)

## 2020-09-07 MED ORDER — CIPROFLOXACIN HCL 500 MG PO TABS
500.0000 mg | ORAL_TABLET | Freq: Two times a day (BID) | ORAL | Status: DC
  Administered 2020-09-07 – 2020-09-08 (×2): 500 mg via ORAL
  Filled 2020-09-07 (×2): qty 1

## 2020-09-07 NOTE — Progress Notes (Signed)
Urology Inpatient Progress Report  Renal calculi [N20.0]  Procedure(s): LEFT NEPHROLITHOTOMY PERCUTANEOUS POSSIBLE URETEROSCOPY WITH STENT PLACEMENT  3 Days Post-Op   Intv/Subj: No acute events overnight. Patient is without complaint. AFVSS. Feeling much improved. Wants to go home. IV not working  Active Problems:   Renal calculi  Current Facility-Administered Medications  Medication Dose Route Frequency Provider Last Rate Last Admin   acetaminophen (TYLENOL) tablet 650 mg  650 mg Oral Q4H PRN Ray Church III, MD   650 mg at 09/05/20 1434   belladonna-opium (B&O) suppository 16.2-60mg   1 suppository Rectal Q6H PRN Crista Elliot, MD       bisacodyl (DULCOLAX) suppository 10 mg  10 mg Rectal Daily PRN Ray Church III, MD   10 mg at 09/06/20 2040   Chlorhexidine Gluconate Cloth 2 % PADS 6 each  6 each Topical Daily Ray Church III, MD   6 each at 09/07/20 1041   ciprofloxacin (CIPRO) tablet 500 mg  500 mg Oral BID Ray Church III, MD       diphenhydrAMINE (BENADRYL) injection 12.5-25 mg  12.5-25 mg Intravenous Q6H PRN Ray Church III, MD       Or   diphenhydrAMINE (BENADRYL) 12.5 MG/5ML elixir 12.5-25 mg  12.5-25 mg Oral Q6H PRN Ray Church III, MD       docusate sodium (COLACE) capsule 100 mg  100 mg Oral BID Ray Church III, MD   100 mg at 09/07/20 0808   enoxaparin (LOVENOX) injection 40 mg  40 mg Subcutaneous Q24H Ray Church III, MD   40 mg at 09/07/20 0017   HYDROcodone-acetaminophen (NORCO/VICODIN) 5-325 MG per tablet 1 tablet  1 tablet Oral Q4H PRN Ray Church III, MD   1 tablet at 09/07/20 1114   HYDROmorphone (DILAUDID) injection 0.5 mg  0.5 mg Intravenous Q3H PRN Ray Church III, MD   0.5 mg at 09/06/20 9323   neomycin-bacitracin-polymyxin (NEOSPORIN) ointment packet 1 application  1 application Topical TID PRN Crista Elliot, MD       ondansetron [facility]) injection 4 mg  4 mg Intravenous Q4H PRN Ray Church III, MD   4 mg at 09/06/20  5573   oxybutynin (DITROPAN) tablet 5 mg  5 mg Oral Q8H PRN Ray Church III, MD   5 mg at 09/06/20 0551   polyethylene glycol (MIRALAX / GLYCOLAX) packet 17 g  17 g Oral Daily Ray Church III, MD   17 g at 09/07/20 0809   senna (SENOKOT) tablet 8.6 mg  1 tablet Oral BID Ray Church III, MD   8.6 mg at 09/07/20 2202   tamsulosin (FLOMAX) capsule 0.4 mg  0.4 mg Oral Daily Ray Church III, MD   0.4 mg at 09/07/20 5427   zolpidem (AMBIEN) tablet 5 mg  5 mg Oral QHS PRN Ray Church III, MD         Objective: Vital: Vitals:   09/06/20 1326 09/06/20 2031 09/07/20 0509 09/07/20 1231  BP: 124/80 122/78 120/75 122/82  Pulse: (!) 107 (!) 102 87 98  Resp: 20 16 16 14   Temp: 98.4 F (36.9 C) 99.1 F (37.3 C) 98.4 F (36.9 C) 98.6 F (37 C)  TempSrc:   Oral Oral  SpO2: 97% 97% 99% 100%  Weight:      Height:       I/Os: I/O last 3 completed shifts: In: 3023.1 [P.O.:480; I.V.:2443.1; IV Piggyback:100] Out:  6400 [Urine:6400]  Physical Exam:  General: Patient is in no apparent distress Lungs: Normal respiratory effort, chest expands symmetrically. GI: IThe abdomen is soft and nontender without mass. Back: flank erythema improving Ext: lower extremities symmetric  Lab Results: Recent Labs    09/05/20 1548 09/06/20 0341 09/07/20 0352  WBC 12.6* 10.3 9.2  HGB 9.8* 9.7* 9.7*  HCT 30.7* 30.6* 30.8*   Recent Labs    09/05/20 1548 09/06/20 0341 09/07/20 0352  NA 136 138 138  K 3.7 4.0 4.5  CL 100 102 103  CO2 25 29 29   GLUCOSE 129* 97 98  BUN 17 13 12   CREATININE 1.04* 0.85 0.85  CALCIUM 9.2 9.0 8.7*   No results for input(s): LABPT, INR in the last 72 hours. No results for input(s): LABURIN in the last 72 hours. Results for orders placed or performed during the hospital encounter of 09/04/20  MRSA Next Gen by PCR, Nasal     Status: None   Collection Time: 09/05/20 11:27 AM   Specimen: Nasal Mucosa; Nasal Swab  Result Value Ref Range Status   MRSA by PCR  Next Gen NOT DETECTED NOT DETECTED Final    Comment: (NOTE) The GeneXpert MRSA Assay (FDA approved for NASAL specimens only), is one component of a comprehensive MRSA colonization surveillance program. It is not intended to diagnose MRSA infection nor to guide or monitor treatment for MRSA infections. Test performance is not FDA approved in patients less than 62 years old. Performed at [facility], [address], [place], [place]   Urine Culture     Status: Abnormal (Preliminary result)   Collection Time: 09/05/20  2:57 PM   Specimen: Urine, Clean Catch  Result Value Ref Range Status   Specimen Description   Final    URINE, CLEAN CATCH Performed at [facility], [address] [place], [place]    Special Requests   Final    NONE Performed at [facility], [address] [place], [place]    Culture (A)  Final    10,000 COLONIES/mL ENTEROBACTER AEROGENES SUSCEPTIBILITIES TO FOLLOW Performed at [facility], [address], [place], [address] [place]    Report Status PENDING  Incomplete  Culture, blood (routine x 2)     Status: None (Preliminary result)   Collection Time: 09/05/20  3:42 PM   Specimen: BLOOD RIGHT HAND  Result Value Ref Range Status   Specimen Description   Final    BLOOD RIGHT HAND Performed at [facility], [address], [place], [place]    Special Requests   Final    BOTTLES DRAWN AEROBIC ONLY Blood Culture results may not be optimal due to an inadequate volume of blood received in culture bottles Performed at [facility], [address], [place], [place]    Culture   Final    NO GROWTH 2 DAYS Performed at [facility], [address], [place], [address] [place]    Report Status PENDING  Incomplete  Culture, blood (routine x 2)     Status: None (Preliminary result)   Collection Time: 09/05/20   3:47 PM   Specimen: BLOOD  Result Value Ref Range Status   Specimen Description   Final    BLOOD LEFT ANTECUBITAL Performed at [facility], [address] [place], [place]    Special Requests   Final  BOTTLES DRAWN AEROBIC AND ANAEROBIC Blood Culture adequate volume Performed at [facility], [address] [place], [place] [address]    Culture   Final    NO GROWTH 2 DAYS Performed at [facility], [address] [place], [place] [address]    Report Status PENDING  Incomplete    Studies/Results: DG Chest 1 View  Result Date: 09/05/2020 CLINICAL DATA:  40 year old female with fever. EXAM: CHEST  1 VIEW COMPARISON:  Chest radiograph dated 08/28/2018. FINDINGS: Bibasilar platelike and streaky atelectasis. Developing infiltrate is less likely but not excluded clinical correlation is recommended. No lobar consolidation, pleural effusion, or pneumothorax. The cardiac silhouette is within normal limits. No acute osseous pathology. IMPRESSION: Bibasilar atelectasis. Developing infiltrate is less likely but not excluded. Electronically Signed   By: Elgie Collard M.D.   On: 09/05/2020 15:40    Assessment: Left renal and ureteral calculi SIRS likely 2/2 to UTI. Bcx NGTD. Ucx 10,000 gram neg rods final pending  Procedure(s): LEFT NEPHROLITHOTOMY PERCUTANEOUS POSSIBLE URETEROSCOPY WITH STENT PLACEMENT, 3 Days Post-Op  doing well.  Plan: Stop IV fluids Encouraged to stay another night on observation. Will dc Ceftriaxone and start cipro 500mg  BID Continue lovenox, SCDs DVT ppx Possible dc tomorrow if AFVSS on oral abx.   , MD Urology 09/07/2020, 1:01 PM

## 2020-09-07 NOTE — Plan of Care (Signed)
  Problem: Activity: Goal: Risk for activity intolerance will decrease Outcome: Progressing   Problem: Elimination: Goal: Will not experience complications related to bowel motility Outcome: Progressing   Problem: Pain Managment: Goal: General experience of comfort will improve Outcome: Progressing   

## 2020-09-07 NOTE — Plan of Care (Signed)

## 2020-09-08 LAB — URINE CULTURE: Culture: 10000 — AB

## 2020-09-08 MED ORDER — HYDROCODONE-ACETAMINOPHEN 5-325 MG PO TABS: 1.0000 | ORAL_TABLET | ORAL | 0 refills | Status: DC | PRN

## 2020-09-08 MED ORDER — CIPROFLOXACIN HCL 500 MG PO TABS: 500.0000 mg | ORAL_TABLET | Freq: Two times a day (BID) | ORAL | 0 refills | Status: DC

## 2020-09-08 NOTE — Progress Notes (Signed)
Patient is eager to discharge. No change from am assessment. No changes related to left flank area. Pt remains stable. Discharge instructions were reviewed. No questions or concerns at this time.

## 2020-09-08 NOTE — Discharge Summary (Signed)
Physician Discharge Summary  Patient ID: Kathryn Crawford MRN: [id number] DOB/AGE: 1980/09/09 40 y.o.  Admit date: 09/04/2020 Discharge date: 09/08/2020  Admission Diagnoses:  Discharge Diagnoses:  Active Problems:   Renal calculi   Discharged Condition: good  Hospital Course: 40 year old female underwent a left PCNL on 09/04/2020.  Procedure went well but postoperatively she developed fever and tachycardia.  She had signs of cystic inflammatory response with possible sepsis.  Blood culture and urine culture were sent.  Blood cultures were negative and urine culture revealed Enterobacter sensitive to ciprofloxacin.  She had been switched over to oral ciprofloxacin because she lost IV access and was stable on 7/28.  She remained afebrile and was feeling improved.  Therefore she was discharged on 7/29.  Consults: None  Significant Diagnostic Studies: labs: As above  Treatments: antibiotics: Cipro and ceftriaxone.  Now transitioned to ciprofloxacin.  Discharge Exam: Blood pressure 117/71, pulse 89, temperature 98.5 F (36.9 C), temperature source Oral, resp. rate 18, height 5\' 9"  (1.753 m), weight 86.9 kg, SpO2 99 %. General appearance: alert no acute distress Adequate perfusion of extremities Nonlabored respiration, good oxygen saturation Left-sided flank incision clean dry and intact.  No fluctuance or purulence.  Erythema improved  Disposition: Discharge disposition: 01-Home or Self Care       Discharge Instructions     No wound care   Complete by: As directed       Allergies as of 09/08/2020   No Known Allergies      Medication List     STOP taking these medications    aspirin-acetaminophen-caffeine 250-250-65 MG tablet Commonly known as: EXCEDRIN MIGRAINE       TAKE these medications    acetaminophen 500 MG tablet Commonly known as: TYLENOL Take 1,000 mg by mouth every 6 (six) hours as needed for mild pain, fever or headache.   acidophilus Caps  capsule Take 1 capsule by mouth daily.   cephALEXin 500 MG capsule Commonly known as: KEFLEX Take 500 mg by mouth in the morning and at bedtime. X 10 days started on 08/21/20 then 250 mg daily after that   ciprofloxacin 500 MG tablet Commonly known as: CIPRO Take 1 tablet (500 mg total) by mouth 2 (two) times daily.   fluconazole 150 MG tablet Commonly known as: DIFLUCAN Take 150 mg by mouth daily. Once a week for 2 weeks started on 08/21/20   HYDROcodone-acetaminophen 5-325 MG tablet Commonly known as: NORCO/VICODIN Take 1 tablet by mouth every 6 (six) hours as needed for moderate pain. What changed: Another medication with the same name was added. Make sure you understand how and when to take each.   HYDROcodone-acetaminophen 5-325 MG tablet Commonly known as: NORCO/VICODIN Take 1 tablet by mouth every 4 (four) hours as needed for severe pain. What changed: You were already taking a medication with the same name, and this prescription was added. Make sure you understand how and when to take each.   ketorolac 10 MG tablet Commonly known as: TORADOL Take 10 mg by mouth every 6 (six) hours as needed.   ondansetron 4 MG tablet Commonly known as: ZOFRAN Take 4 mg by mouth every 8 (eight) hours as needed for nausea or vomiting.   tamsulosin 0.4 MG Caps capsule Commonly known as: FLOMAX Take 1 capsule (0.4 mg total) by mouth daily.   triamcinolone cream 0.1 % Commonly known as: KENALOG Apply 1 application topically daily as needed (eczema).   URIBEL PO Take by mouth.  Signed: Ray Church, III 09/08/2020, 2:36 PM

## 2020-09-10 LAB — CULTURE, BLOOD (ROUTINE X 2)
Culture: NO GROWTH
Culture: NO GROWTH
Special Requests: ADEQUATE

## 2020-09-12 ENCOUNTER — Other Ambulatory Visit: Payer: Self-pay | Admitting: Urology

## 2020-09-12 ENCOUNTER — Encounter (HOSPITAL_BASED_OUTPATIENT_CLINIC_OR_DEPARTMENT_OTHER): Payer: Self-pay | Admitting: Radiology

## 2020-09-12 ENCOUNTER — Ambulatory Visit (HOSPITAL_BASED_OUTPATIENT_CLINIC_OR_DEPARTMENT_OTHER)
Admission: RE | Admit: 2020-09-12 | Discharge: 2020-09-12 | Disposition: A | Discharge status: Home / Self Care | Payer: No Typology Code available for payment source | Source: Ambulatory Visit | Attending: Urology | Admitting: Urology

## 2020-09-12 ENCOUNTER — Other Ambulatory Visit: Payer: Self-pay

## 2020-09-12 DIAGNOSIS — R0602 Shortness of breath: Secondary | ICD-10-CM

## 2020-09-12 MED ORDER — IOHEXOL 350 MG/ML SOLN
75.0000 mL | Freq: Once | INTRAVENOUS | Status: AC | PRN
  Administered 2020-09-12: 60 mL via INTRAVENOUS

## 2020-11-27 ENCOUNTER — Other Ambulatory Visit: Payer: Self-pay | Admitting: Urology

## 2020-12-20 ENCOUNTER — Encounter (HOSPITAL_BASED_OUTPATIENT_CLINIC_OR_DEPARTMENT_OTHER): Payer: Self-pay | Admitting: Urology

## 2020-12-20 ENCOUNTER — Other Ambulatory Visit: Payer: Self-pay

## 2020-12-20 NOTE — Progress Notes (Signed)
Spoke w/ via phone for pre-op interview---patient Lab needs dos---- urine POCT             Lab results------none COVID test -----Patient will return from [place] on 12/24/20. Per Remo Lipps, RN assistant director, patient can take an at home COVID test on 12/24/20. As long as she is negative and has no symptoms, she can come in for surgery. She should come in at 0515. She can be prepped for surgery, but patient will need a rapid Covid test the morning of surgery. Arrive at -------0515 on 12/25/2020 NPO after MN NO Solid Food.  Clear liquids from MN until---0430 Med rec completed Medications to take morning of surgery -----none Diabetic medication -----n/a Patient instructed no nail polish to be worn day of surgery Patient instructed to bring photo id and insurance card day of surgery Patient aware to have Driver (ride ) / caregiver    for 24 hours after surgery - husband  Patient Special Instructions -----Take an at home Covid test on 12/24/20. Pre-Op special Istructions -----Patient will need a rapid Covid test the morning of surgery. Patient verbalized understanding of instructions that were given at this phone interview. Patient denies shortness of breath, chest pain, fever, cough at this phone interview.

## 2020-12-24 NOTE — Anesthesia Preprocedure Evaluation (Addendum)
Anesthesia Evaluation  Patient identified by MRN, date of birth, ID band Patient awake    Reviewed: Allergy & Precautions, NPO status , Patient's Chart, lab work & pertinent test results  Airway Mallampati: II  TM Distance: >3 FB Neck ROM: Full    Dental no notable dental hx.    Pulmonary neg pulmonary ROS, former smoker,    Pulmonary exam normal        Cardiovascular negative cardio ROS   Rhythm:Regular Rate:Normal     Neuro/Psych  Headaches, Anxiety    GI/Hepatic negative GI ROS, Neg liver ROS,   Endo/Other  negative endocrine ROS  Renal/GU Renal diseaseRenal stones   negative genitourinary   Musculoskeletal negative musculoskeletal ROS (+)   Abdominal Normal abdominal exam  (+)   Peds  Hematology  (+) anemia ,   Anesthesia Other Findings   Reproductive/Obstetrics                            Anesthesia Physical Anesthesia Plan  ASA: 2  Anesthesia Plan: General   Post-op Pain Management:    Induction: Intravenous  PONV Risk Score and Plan: 3 and Ondansetron, Dexamethasone, Midazolam and Treatment may vary due to age or medical condition  Airway Management Planned: Mask and LMA  Additional Equipment: None  Intra-op Plan:   Post-operative Plan: Extubation in OR  Informed Consent: I have reviewed the patients History and Physical, chart, labs and discussed the procedure including the risks, benefits and alternatives for the proposed anesthesia with the patient or authorized representative who has indicated his/her understanding and acceptance.     Dental advisory given  Plan Discussed with: CRNA  Anesthesia Plan Comments: (Lab Results      Component                Value               Date                      WBC                      9.2                 09/07/2020                HGB                      9.7 (L)             09/07/2020                HCT                       30.8 (L)            09/07/2020                MCV                      92.2                09/07/2020                PLT                      294  09/07/2020           Lab Results      Component                Value               Date                      NA                       138                 09/07/2020                K                        4.5                 09/07/2020                CO2                      29                  09/07/2020                GLUCOSE                  98                  09/07/2020                BUN                      12                  09/07/2020                CREATININE               0.85                09/07/2020                CALCIUM                  8.7 (L)             09/07/2020                GFRNONAA                 >60                 09/07/2020          )       Anesthesia Quick Evaluation

## 2020-12-25 ENCOUNTER — Ambulatory Visit (HOSPITAL_BASED_OUTPATIENT_CLINIC_OR_DEPARTMENT_OTHER): Payer: No Typology Code available for payment source | Admitting: Anesthesiology

## 2020-12-25 ENCOUNTER — Encounter (HOSPITAL_BASED_OUTPATIENT_CLINIC_OR_DEPARTMENT_OTHER)
Admission: RE | Disposition: A | Discharge status: Home / Self Care | Payer: Self-pay | Source: Home / Self Care | Attending: Urology

## 2020-12-25 ENCOUNTER — Other Ambulatory Visit: Payer: Self-pay

## 2020-12-25 ENCOUNTER — Observation Stay (HOSPITAL_BASED_OUTPATIENT_CLINIC_OR_DEPARTMENT_OTHER)
Admission: RE | Admit: 2020-12-25 | Discharge: 2020-12-26 | Disposition: A | Discharge status: Home / Self Care | Payer: No Typology Code available for payment source | Attending: Urology | Admitting: Urology

## 2020-12-25 ENCOUNTER — Encounter (HOSPITAL_BASED_OUTPATIENT_CLINIC_OR_DEPARTMENT_OTHER): Payer: Self-pay | Admitting: Urology

## 2020-12-25 DIAGNOSIS — Z87891 Personal history of nicotine dependence: Secondary | ICD-10-CM | POA: Diagnosis not present

## 2020-12-25 DIAGNOSIS — N200 Calculus of kidney: Secondary | ICD-10-CM | POA: Diagnosis present

## 2020-12-25 DIAGNOSIS — Z20822 Contact with and (suspected) exposure to covid-19: Secondary | ICD-10-CM | POA: Diagnosis not present

## 2020-12-25 DIAGNOSIS — Z8616 Personal history of COVID-19: Secondary | ICD-10-CM | POA: Diagnosis not present

## 2020-12-25 HISTORY — PX: CYSTOSCOPY/URETEROSCOPY/HOLMIUM LASER/STENT PLACEMENT: SHX6546

## 2020-12-25 HISTORY — PX: HOLMIUM LASER APPLICATION: SHX5852

## 2020-12-25 HISTORY — DX: Stress incontinence (female) (male): N39.3

## 2020-12-25 LAB — RESP PANEL BY RT-PCR (FLU A&B, COVID) ARPGX2
Influenza A by PCR: NEGATIVE
Influenza B by PCR: NEGATIVE
SARS Coronavirus 2 by RT PCR: NEGATIVE

## 2020-12-25 LAB — POCT PREGNANCY, URINE: Preg Test, Ur: NEGATIVE

## 2020-12-25 SURGERY — CYSTOSCOPY/URETEROSCOPY/HOLMIUM LASER/STENT PLACEMENT
Anesthesia: General | Site: Renal | Laterality: Right

## 2020-12-25 MED ORDER — SCOPOLAMINE 1 MG/3DAYS TD PT72
1.0000 | MEDICATED_PATCH | TRANSDERMAL | Status: DC
  Administered 2020-12-25: 1.5 mg via TRANSDERMAL

## 2020-12-25 MED ORDER — ZOLPIDEM TARTRATE 5 MG PO TABS
5.0000 mg | ORAL_TABLET | Freq: Every evening | ORAL | Status: DC | PRN
  Administered 2020-12-25: 5 mg via ORAL

## 2020-12-25 MED ORDER — BELLADONNA ALKALOIDS-OPIUM 16.2-60 MG RE SUPP: 1.0000 | Freq: Four times a day (QID) | RECTAL | Status: DC | PRN

## 2020-12-25 MED ORDER — OXYBUTYNIN CHLORIDE 5 MG PO TABS
ORAL_TABLET | ORAL | Status: AC
  Filled 2020-12-25: qty 1

## 2020-12-25 MED ORDER — IOHEXOL 300 MG/ML  SOLN
INTRAMUSCULAR | Status: DC | PRN
  Administered 2020-12-25: 3 mL

## 2020-12-25 MED ORDER — MORPHINE SULFATE (PF) 4 MG/ML IV SOLN: 2.0000 mg | INTRAVENOUS | Status: DC | PRN

## 2020-12-25 MED ORDER — ONDANSETRON HCL 4 MG/2ML IJ SOLN: 4.0000 mg | INTRAMUSCULAR | Status: DC | PRN

## 2020-12-25 MED ORDER — DIPHENHYDRAMINE HCL 12.5 MG/5ML PO ELIX: 12.5000 mg | ORAL_SOLUTION | Freq: Four times a day (QID) | ORAL | Status: DC | PRN

## 2020-12-25 MED ORDER — SCOPOLAMINE 1 MG/3DAYS TD PT72
MEDICATED_PATCH | TRANSDERMAL | Status: AC
  Filled 2020-12-25: qty 1

## 2020-12-25 MED ORDER — FENTANYL CITRATE (PF) 100 MCG/2ML IJ SOLN
INTRAMUSCULAR | Status: DC | PRN
  Administered 2020-12-25: 50 ug via INTRAVENOUS
  Administered 2020-12-25 (×2): 25 ug via INTRAVENOUS

## 2020-12-25 MED ORDER — LACTATED RINGERS IV SOLN: INTRAVENOUS | Status: DC

## 2020-12-25 MED ORDER — PROPOFOL 10 MG/ML IV BOLUS
INTRAVENOUS | Status: DC | PRN
  Administered 2020-12-25: 160 mg via INTRAVENOUS

## 2020-12-25 MED ORDER — DIPHENHYDRAMINE HCL 50 MG/ML IJ SOLN
INTRAMUSCULAR | Status: AC
  Filled 2020-12-25: qty 1

## 2020-12-25 MED ORDER — ZOLPIDEM TARTRATE 5 MG PO TABS
ORAL_TABLET | ORAL | Status: AC
  Filled 2020-12-25: qty 1

## 2020-12-25 MED ORDER — DIPHENHYDRAMINE HCL 50 MG/ML IJ SOLN: 12.5000 mg | Freq: Four times a day (QID) | INTRAMUSCULAR | Status: DC | PRN

## 2020-12-25 MED ORDER — OXYCODONE HCL 5 MG PO TABS
ORAL_TABLET | ORAL | Status: AC
  Filled 2020-12-25: qty 1

## 2020-12-25 MED ORDER — ACETAMINOPHEN 325 MG PO TABS
ORAL_TABLET | ORAL | Status: AC
  Filled 2020-12-25: qty 2

## 2020-12-25 MED ORDER — ACETAMINOPHEN 325 MG PO TABS
650.0000 mg | ORAL_TABLET | ORAL | Status: DC | PRN
  Administered 2020-12-25 – 2020-12-26 (×4): 650 mg via ORAL

## 2020-12-25 MED ORDER — SODIUM CHLORIDE 0.9 % IR SOLN
Status: DC | PRN
  Administered 2020-12-25: 3000 mL

## 2020-12-25 MED ORDER — ONDANSETRON HCL 4 MG/2ML IJ SOLN
INTRAMUSCULAR | Status: AC
  Filled 2020-12-25: qty 2

## 2020-12-25 MED ORDER — 0.9 % SODIUM CHLORIDE (POUR BTL) OPTIME
TOPICAL | Status: DC | PRN
  Administered 2020-12-25: 500 mL

## 2020-12-25 MED ORDER — SULFAMETHOXAZOLE-TRIMETHOPRIM 800-160 MG PO TABS
1.0000 | ORAL_TABLET | Freq: Two times a day (BID) | ORAL | Status: DC
  Administered 2020-12-25 (×2): 1 via ORAL
  Filled 2020-12-25 (×3): qty 1

## 2020-12-25 MED ORDER — MIDAZOLAM HCL 2 MG/2ML IJ SOLN
INTRAMUSCULAR | Status: AC
  Filled 2020-12-25: qty 2

## 2020-12-25 MED ORDER — FENTANYL CITRATE (PF) 100 MCG/2ML IJ SOLN: 25.0000 ug | INTRAMUSCULAR | Status: DC | PRN

## 2020-12-25 MED ORDER — DIPHENHYDRAMINE HCL 50 MG/ML IJ SOLN
INTRAMUSCULAR | Status: DC | PRN
  Administered 2020-12-25: 12.5 mg via INTRAVENOUS

## 2020-12-25 MED ORDER — HYDROCODONE-ACETAMINOPHEN 5-325 MG PO TABS: 1.0000 | ORAL_TABLET | ORAL | 0 refills | Status: DC | PRN

## 2020-12-25 MED ORDER — PROPOFOL 10 MG/ML IV BOLUS
INTRAVENOUS | Status: AC
  Filled 2020-12-25: qty 20

## 2020-12-25 MED ORDER — OXYCODONE HCL 5 MG PO TABS
5.0000 mg | ORAL_TABLET | ORAL | Status: DC | PRN
  Administered 2020-12-25: 5 mg via ORAL

## 2020-12-25 MED ORDER — CIPROFLOXACIN IN D5W 400 MG/200ML IV SOLN
INTRAVENOUS | Status: AC
  Filled 2020-12-25: qty 200

## 2020-12-25 MED ORDER — ONDANSETRON HCL 4 MG/2ML IJ SOLN
INTRAMUSCULAR | Status: DC | PRN
  Administered 2020-12-25: 4 mg via INTRAVENOUS

## 2020-12-25 MED ORDER — SENNA 8.6 MG PO TABS: 1.0000 | ORAL_TABLET | Freq: Two times a day (BID) | ORAL | Status: DC

## 2020-12-25 MED ORDER — OXYBUTYNIN CHLORIDE 5 MG PO TABS
5.0000 mg | ORAL_TABLET | Freq: Three times a day (TID) | ORAL | Status: DC | PRN
  Administered 2020-12-25: 5 mg via ORAL

## 2020-12-25 MED ORDER — LIDOCAINE HCL (CARDIAC) PF 100 MG/5ML IV SOSY
PREFILLED_SYRINGE | INTRAVENOUS | Status: DC | PRN
  Administered 2020-12-25: 60 mg via INTRAVENOUS

## 2020-12-25 MED ORDER — ACETAMINOPHEN 10 MG/ML IV SOLN: 1000.0000 mg | Freq: Once | INTRAVENOUS | Status: DC | PRN

## 2020-12-25 MED ORDER — SULFAMETHOXAZOLE-TRIMETHOPRIM 800-160 MG PO TABS: 1.0000 | ORAL_TABLET | Freq: Two times a day (BID) | ORAL | 0 refills | Status: DC

## 2020-12-25 MED ORDER — CIPROFLOXACIN IN D5W 400 MG/200ML IV SOLN
400.0000 mg | INTRAVENOUS | Status: AC
  Administered 2020-12-25: 400 mg via INTRAVENOUS

## 2020-12-25 MED ORDER — PROMETHAZINE HCL 25 MG/ML IJ SOLN: 6.2500 mg | INTRAMUSCULAR | Status: DC | PRN

## 2020-12-25 MED ORDER — DEXAMETHASONE SODIUM PHOSPHATE 10 MG/ML IJ SOLN
INTRAMUSCULAR | Status: DC | PRN
  Administered 2020-12-25: 10 mg via INTRAVENOUS

## 2020-12-25 MED ORDER — DOCUSATE SODIUM 100 MG PO CAPS: 100.0000 mg | ORAL_CAPSULE | Freq: Two times a day (BID) | ORAL | Status: DC

## 2020-12-25 MED ORDER — DEXAMETHASONE SODIUM PHOSPHATE 10 MG/ML IJ SOLN
INTRAMUSCULAR | Status: AC
  Filled 2020-12-25: qty 1

## 2020-12-25 MED ORDER — INDAPAMIDE 2.5 MG PO TABS
2.5000 mg | ORAL_TABLET | Freq: Every day | ORAL | Status: DC
  Administered 2020-12-25: 2.5 mg via ORAL
  Filled 2020-12-25 (×2): qty 1

## 2020-12-25 MED ORDER — FENTANYL CITRATE (PF) 100 MCG/2ML IJ SOLN
INTRAMUSCULAR | Status: AC
  Filled 2020-12-25: qty 2

## 2020-12-25 SURGICAL SUPPLY — 23 items
BAG DRAIN URO-CYSTO SKYTR STRL (DRAIN) ×2 IMPLANT
BAG DRN UROCATH (DRAIN) ×1
CATH INTERMIT  6FR 70CM (CATHETERS) IMPLANT
CLOTH BEACON ORANGE TIMEOUT ST (SAFETY) ×2 IMPLANT
COVER DOME SNAP 22 D (MISCELLANEOUS) ×1 IMPLANT
FIBER LASER FLEXIVA 365 (UROLOGICAL SUPPLIES) IMPLANT
GLOVE SURG ENC MOIS LTX SZ7.5 (GLOVE) ×2 IMPLANT
GLOVE SURG NEOP MICRO LF SZ6.5 (GLOVE) ×2 IMPLANT
GOWN STRL REUS W/ TWL LRG LVL3 (GOWN DISPOSABLE) IMPLANT
GOWN STRL REUS W/TWL LRG LVL3 (GOWN DISPOSABLE) ×2
GOWN STRL REUS W/TWL XL LVL3 (GOWN DISPOSABLE) IMPLANT
GUIDEWIRE STR DUAL SENSOR (WIRE) ×2 IMPLANT
IV NS IRRIG 3000ML ARTHROMATIC (IV SOLUTION) ×3 IMPLANT
KIT TURNOVER CYSTO (KITS) ×2 IMPLANT
MANIFOLD NEPTUNE II (INSTRUMENTS) ×2 IMPLANT
NS IRRIG 500ML POUR BTL (IV SOLUTION) ×2 IMPLANT
PACK CYSTO (CUSTOM PROCEDURE TRAY) ×2 IMPLANT
SHEATH URET ACCESS 12FR/35CM (UROLOGICAL SUPPLIES) ×1 IMPLANT
STENT URET 6FRX26 CONTOUR (STENTS) ×1 IMPLANT
TRACTIP FLEXIVA PULS ID 200XHI (Laser) IMPLANT
TRACTIP FLEXIVA PULSE ID 200 (Laser) ×2
TUBE CONNECTING 12X1/4 (SUCTIONS) ×1 IMPLANT
TUBING UROLOGY SET (TUBING) ×1 IMPLANT

## 2020-12-25 NOTE — Discharge Instructions (Signed)

## 2020-12-25 NOTE — Op Note (Signed)
Operative Note  Preoperative diagnosis:  1.  Right renal calculus, recurrent UTI  Postoperative diagnosis: 1.  Same  Procedure(s): 1.  Cystoscopy with right retrograde pyelogram, right ureteroscopy with laser lithotripsy, ureteral stent placement  Surgeon: Modena Slater, MD  Assistants: None  Anesthesia: General  Complications: None immediate  EBL: Minimal  Specimens: 1.  None  Drains/Catheters: 1.  6 x 26 double-J ureteral stent  Intraoperative findings: 1.  Normal urethra and bladder 2.  Large right renal calculus in the upper pole and a narrow calyx.  All stone fragmented to tiny fragments.  Retrograde pyelogram at the end showed no evidence of any filling defect.  Stones on x-ray were all addressed  Indication: 40 year old female with a history of nephrolithiasis and recurrent UTIs.  She has undergone a left PCNL in the past.  This was complicated by sepsis.  After discussion of different options for the right stone burden, she elected to undergo ureteroscopy rather than undergo PCNL.  Description of procedure:  The patient was identified and consent was obtained.  The patient was taken to the operating room and placed in the supine position.  The patient was placed under general anesthesia.  Perioperative antibiotics were administered.  The patient was placed in dorsal lithotomy.  Patient was prepped and draped in a standard sterile fashion and a timeout was performed.  A 21 French rigid cystoscope was advanced into the urethra and into the bladder.  Complete cystoscopy was performed with no abnormal findings.  The right ureter was cannulated with a sensor wire which was advanced up to the kidney under fluoroscopic guidance.  A second sensor wire was advanced alongside this up to the kidney.  The scope was withdrawn.  1 the wires was secured to the drape as a safety wire.  The other wire was used to advance a 12 x 14 ureteral access sheath over the wire under continuous  fluoroscopic guidance.  This passed easily up to the level of the renal pelvis.  The inner sheath and wire were withdrawn.  Digital ureteroscopy was performed and the stones were addressed.  The stones were dusted to tiny fragments.  All stone burden was addressed.  I shot a retrograde pyelogram through the scope with the findings noted above.  I withdrew the scope along with the access sheath visualizing the entire ureter upon removal.  There were no ureteral stones and no obvious ureteral injury.  Wire was backloaded onto rigid cystoscope which was advanced into the bladder followed by routine placement of a 6 x 26 double-J ureteral stent.  Fluoroscopy confirmed proximal placement and direct visualization confirmed a good coil within the bladder.  I drained the bladder and withdrew the scope.  Patient tolerated the procedure well and was stable postoperative.  Plan: Follow-up in 1 week for stent removal.  I will plan to keep her overnight for observation given her history of sepsis and stones that harbor infection.

## 2020-12-25 NOTE — Transfer of Care (Signed)
Immediate Anesthesia Transfer of Care Note  Patient: Kathryn Crawford  Procedure(s) Performed: CYSTOSCOPY RIGHT URETEROSCOPY/, RETROGRADE, HOLMIUM LASER/STENT PLACEMENT (Right: Renal) HOLMIUM LASER APPLICATION (Right: Renal)  Patient Location: PACU  Anesthesia Type:General  Level of Consciousness: sedated and responds to stimulation  Airway & Oxygen Therapy: Patient Spontanous Breathing and Patient connected to face mask oxygen  Post-op Assessment: Report given to RN, Post -op Vital signs reviewed and stable and Patient moving all extremities  Post vital signs: Reviewed and stable  Last Vitals:  Vitals Value Taken Time  BP 121/86 12/25/20 0830  Temp    Pulse 87 12/25/20 0832  Resp 14 12/25/20 0832  SpO2 97 % 12/25/20 0832  Vitals shown include unvalidated device data.  Last Pain:  Vitals:   12/25/20 0617  TempSrc: Oral         Complications: No notable events documented.

## 2020-12-25 NOTE — H&P (Signed)
H&P   Chief Complaint: Right renal calculi  History of Present Illness: Patient underwent a renal ultrasound and KUB. Left kidney was normal without hydronephrosis or calcium deposits. Right kidney on KUB had a large renal calculus about 3 cm. She has no hydronephrosis on the right. She feels like she passed a stone in the past week. She has been having some fatigue but denies any voiding complaints. The PCNL on the left revealed a very soft stone that was easily fragmented. She has been on indapamide for hypercalciuria.  She has been on prophylactic antibiotics.  Past Medical History:  Diagnosis Date   Anemia    Anxiety    COVID-19 08/2019   cough, headache, fever, all symptoms resolved per pt on 12/20/20   History of kidney stones    Pulmonary embolism (HCC) 08/2018   bilateral, pt states PEs were due to [facility], pt stated that she was on blood thinners for 6 months and embolisms resolved per 12/20/20 call with pt   Stress incontinence    Past Surgical History:  Procedure Laterality Date   BREAST ENHANCEMENT SURGERY  2008   CYSTOSCOPY WITH RETROGRADE PYELOGRAM, URETEROSCOPY AND STENT PLACEMENT Left 07/06/2020   Procedure: CYSTOSCOPY WITH RETROGRADE PYELOGRAM, URETEROSCOPY AND STENT PLACEMENT;  Surgeon: Crista Elliot, MD;  Location: [facility];  Service: Urology;  Laterality: Left;   CYSTOSCOPY WITH STENT PLACEMENT Left 09/19/2017   Procedure: CYSTOSCOPY WITH STENT PLACEMENT;  Surgeon: Ihor Gully, MD;  Location: [facility];  Service: Urology;  Laterality: Left;   EXTRACORPOREAL SHOCK WAVE LITHOTRIPSY Left 10/16/2017   Procedure: LEFT EXTRACORPOREAL SHOCK WAVE LITHOTRIPSY (ESWL);  Surgeon: Ihor Gully, MD;  Location: [facility];  Service: Urology;  Laterality: Left;   IR URETERAL STENT LEFT NEW ACCESS W/O SEP NEPHROSTOMY CATH  09/04/2020   NEPHROLITHOTOMY Left 09/04/2020   Procedure: LEFT NEPHROLITHOTOMY PERCUTANEOUS POSSIBLE URETEROSCOPY WITH STENT PLACEMENT;  Surgeon: Crista Elliot, MD;   Location: [facility];  Service: Urology;  Laterality: Left;  REQUESTING 2 HRS   TUBAL LIGATION Bilateral 2006   WISDOM TOOTH EXTRACTION  2020   2 top wisdom teeth removed    Home Medications:  Medications Prior to Admission  Medication Sig Dispense Refill Last Dose   acetaminophen (TYLENOL) 500 MG tablet Take 1,000 mg by mouth every 6 (six) hours as needed for mild pain, fever or headache.   Past Month   HYDROcodone-acetaminophen (NORCO/VICODIN) 5-325 MG tablet Take 1 tablet by mouth every 4 (four) hours as needed for severe pain. 30 tablet 0 Past Week   indapamide (LOZOL) 2.5 MG tablet Take 2.5 mg by mouth daily.   12/24/2020   ondansetron (ZOFRAN) 4 MG tablet Take 4 mg by mouth every 8 (eight) hours as needed for nausea or vomiting.   12/24/2020   sulfamethoxazole-trimethoprim (BACTRIM DS) 800-160 MG tablet Take 1 tablet by mouth 2 (two) times daily. Started on 12/15/2020 and will finish on 12/21/2020.   12/24/2020   triamcinolone cream (KENALOG) 0.1 % Apply 1 application topically daily as needed (eczema).   Past Week   Allergies: No Known Allergies  Family History  Problem Relation Age of Onset   Breast cancer Maternal Grandmother    Breast cancer Paternal Grandmother    Social History:  reports that she quit smoking about 17 years ago. Her smoking use included cigarettes. She has a 10.00 pack-year smoking history. She has never used smokeless tobacco. She reports current alcohol use. She reports that she does not use drugs.  ROS: A complete review of systems was performed.  All systems are negative except for pertinent findings as noted. ROS   Physical Exam:  Vital signs in last 24 hours: Temp:  [98 F (36.7 C)] 98 F (36.7 C) (11/14 0617) Pulse Rate:  [91] 91 (11/14 0617) Resp:  [16] 16 (11/14 0617) BP: (115)/(82) 115/82 (11/14 0617) SpO2:  [98 %] 98 % (11/14 0617) Weight:  [82.1 kg] 82.1 kg (11/14 0617) General:  Alert and oriented, No acute distress HEENT: Normocephalic,  atraumatic Neck: No JVD or lymphadenopathy Cardiovascular: Regular rate and rhythm Lungs: Regular rate and effort Abdomen: Soft, nontender, nondistended, no abdominal masses Back: No CVA tenderness Extremities: No edema Neurologic: Grossly intact  Laboratory Data:  Results for orders placed or performed during the hospital encounter of 12/25/20 (from the past 24 hour(s))  Resp Panel by RT-PCR (Flu A&B, Covid) Nasopharyngeal Swab     Status: None   Collection Time: 12/25/20  5:47 AM   Specimen: Nasopharyngeal Swab; Nasopharyngeal(NP) swabs in vial transport medium  Result Value Ref Range   SARS Coronavirus 2 by RT PCR NEGATIVE NEGATIVE   Influenza A by PCR NEGATIVE NEGATIVE   Influenza B by PCR NEGATIVE NEGATIVE  Pregnancy, urine POC     Status: None   Collection Time: 12/25/20  5:52 AM  Result Value Ref Range   Preg Test, Ur NEGATIVE NEGATIVE   Recent Results (from the past 240 hour(s))  Resp Panel by RT-PCR (Flu A&B, Covid) Nasopharyngeal Swab     Status: None   Collection Time: 12/25/20  5:47 AM   Specimen: Nasopharyngeal Swab; Nasopharyngeal(NP) swabs in vial transport medium  Result Value Ref Range Status   SARS Coronavirus 2 by RT PCR NEGATIVE NEGATIVE Final    Comment: (NOTE) SARS-CoV-2 target nucleic acids are NOT DETECTED.  The SARS-CoV-2 RNA is generally detectable in upper respiratory specimens during the acute phase of infection. The lowest concentration of SARS-CoV-2 viral copies this assay can detect is 138 copies/mL. A negative result does not preclude SARS-Cov-2 infection and should not be used as the sole basis for treatment or other patient management decisions. A negative result may occur with  improper specimen collection/handling, submission of specimen other than nasopharyngeal swab, presence of viral mutation(s) within the areas targeted by this assay, and inadequate number of viral copies(<138 copies/mL). A negative result must be combined  with clinical observations, patient history, and epidemiological information. The expected result is Negative.  Fact Sheet for Patients:  BloggerCourse.com  Fact Sheet for Healthcare Providers:  SeriousBroker.it  This test is no t yet approved or cleared by the [place] FDA and  has been authorized for detection and/or diagnosis of SARS-CoV-2 by FDA under an Emergency Use Authorization (EUA). This EUA will remain  in effect (meaning this test can be used) for the duration of the COVID-19 declaration under Section 564(b)(1) of the Act, 21 U.S.C.section 360bbb-3(b)(1), unless the authorization is terminated  or revoked sooner.       Influenza A by PCR NEGATIVE NEGATIVE Final   Influenza B by PCR NEGATIVE NEGATIVE Final    Comment: (NOTE) The Xpert Xpress SARS-CoV-2/FLU/RSV plus assay is intended as an aid in the diagnosis of influenza from Nasopharyngeal swab specimens and should not be used as a sole basis for treatment. Nasal washings and aspirates are unacceptable for Xpert Xpress SARS-CoV-2/FLU/RSV testing.  Fact Sheet for Patients: BloggerCourse.com  Fact Sheet for Healthcare Providers: SeriousBroker.it  This test is not yet approved or cleared by  the [employer] and has been authorized for detection and/or diagnosis of SARS-CoV-2 by FDA under an Emergency Use Authorization (EUA). This EUA will remain in effect (meaning this test can be used) for the duration of the COVID-19 declaration under Section 564(b)(1) of the Act, 21 U.S.C. section 360bbb-3(b)(1), unless the authorization is terminated or revoked.  Performed at [facility], [address], [place], [place] [address]    Creatinine: No results for input(s): CREATININE in the last 168 hours.  Impression/Assessment:  Right renal calculi   Plan:  Plan for cystoscopy with right  ureteroscopy with laser lithotripsy with right ureteral stent placement.  Ray Church, III 12/25/2020, 7:27 AM

## 2020-12-25 NOTE — H&P (Deleted)
CC/HPI: Patient with a history of neurogenic bladder with detrusor overactivity. He is managed with suprapubic tube. He is also managed with Botox. Last performed in April. He is due for this. He comes in with a suprapubic tube that is not draining. We exchanged it and it is now draining well.       ALLERGIES: Cipro TABS Contrast Media Ready-Box MISC Latex Gloves MISC Vancomycin HCl SOLR     MEDICATIONS: Oxybutynin Chloride 5 mg tablet TAKE 1 TABLET BY MOUTH TWICE A DAY  Antacid  Calcium + D  Cranberry Fruit  Docusate Sodium  Klor-Con 20 meq packet  Metoprolol Tartrate 50 mg tablet Oral  Multi-Day Vitamins tablet Oral  Rosuvastatin Calcium 20 mg tablet  Tylenol Arthritis PRN  Verapamil Hcl 1 PO Daily  Vitamin C  Zofran 4 mg tablet Oral      GU PSH: Catheterization For Collection Of Specimen, Single Patient, All Places Of Service - 2020, 2019, 2019 Catheterize For Residual - 2019, 2017 Compl Change SP Tube - 01/11/2020, 2021, 2020 Complex cystometrogram, w/ void pressure and urethral pressure profile studies, any technique - 2020 Complex Uroflow - 2020 Cysto Bladder Stone <2.5cm - 12/01/2019 Cystoscopy - 09/27/2019, 2021, 2021 Cystourethroscopy, W/Injection For Chemodenervation Of Bladder - 05/15/2020, 12/01/2019, 2021, 2020 Emg surf Electrd - 2020 Inject For cystogram - 2020 Insert Bladder Cath; Complex - 09/27/2019, 2021 Intrabd voidng Press - 2020 Simple Change SP Tube - 11/22/2020, 11/08/2020, 10/26/2020, 10/09/2020, 09/29/2020, 08/25/2020, 07/25/2020, 07/12/2020, 06/14/2020, 05/15/2020, 04/25/2020, 03/31/2020, 03/16/2020, 03/07/2020, 02/15/2020, 01/28/2020, 12/31/2019, 12/17/2019, 12/01/2019, 11/09/2019, 2021, 2021, 2021, 2021, 01/22/2019, 12/18/2018, 2020, 2020        PSH Notes: Ileostomy, Colostomy, Cholecystectomy, Appendectomy    NON-GU PSH: Appendectomy - 2016 Cholecystectomy (open) - 2016 Colostomy - 2016       GU PMH: Bladder, Neuromuscular dysfunction, Unspec - 11/22/2020, -  11/08/2020, - 10/26/2020, - 10/09/2020, - 09/29/2020, - 08/25/2020, - 07/25/2020, - 07/12/2020, - 06/14/2020, - 04/25/2020, - 03/31/2020, - 03/16/2020, - 02/15/2020, - 01/11/2020, - 12/17/2019, - 12/16/2019, - 11/09/2019, Neurogenic bladder, - 2016 Bladder, Oth neuromuscular dysfunction - 07/12/2020, - 06/14/2020, - 03/07/2020, - 11/09/2019, - 2020 Unihibited neuropathic bladder - 07/12/2020, - 03/07/2020, - 2020, - 2020 Incomplete bladder emptying - 06/14/2020, - 03/07/2020, - 01/28/2020, - 12/31/2019, - 2020 Gross hematuria - 12/16/2019 Incontinence w/o Sensation - 2020 Candidal balanitis - 2019 Oth urogenital candidiasis - 2019 Obstructive and reflux uropathy, Unspec - 2019 Chronic cystitis (w/o hematuria), Chronic cystitis - 2016 Urge incontinence, Urge incontinence of urine - 2016     NON-GU PMH: Other mechanical complication of cystostomy catheter, initial encounter - 10/26/2020, - 03/16/2020, - 02/15/2020, - 12/17/2019, - 2021 Spina bifida, unspecified - 2020 Encounter for general adult medical examination without abnormal findings, Encounter for preventive health examination - 2016 Personal history of other diseases of the circulatory system, History of hypertension - 2016 Personal history of other diseases of the nervous system and sense organs, History of hydrocephalus - 2016 Personal history of other specified (corrected) congenital malformations of nervous system and sense organs, History of spina bifida - 2016 Personal history of other specified conditions, History of heartburn - 2016     FAMILY HISTORY: Death - Father Diabetes - Runs In Family Hypertension - Runs In Family Tuberculosis - Runs In Family    SOCIAL HISTORY: Marital Status: Single Preferred Language: English; Ethnicity: Not Hispanic Or Latino; Race: White Current Smoking Status: Patient has never smoked.  Drinks 1 caffeinated drink per day.     Notes:   Caffeine use, Never smoker, Alcohol use, Disabled, Single, Number of children, Never used  tobacco    REVIEW OF SYSTEMS:    GU Review Female:   Patient denies frequent urination, hard to postpone urination, burning/ pain with urination, get up at night to urinate, leakage of urine, stream starts and stops, trouble starting your stream, have to strain to urinate , erection problems, and penile pain.  Gastrointestinal (Upper):   Patient denies nausea, vomiting, and indigestion/ heartburn.  Gastrointestinal (Lower):   Patient denies diarrhea and constipation.  Constitutional:   Patient denies fever, night sweats, weight loss, and fatigue.  Skin:   Patient denies skin rash/ lesion and itching.  Eyes:   Patient denies blurred vision and double vision.  Ears/ Nose/ Throat:   Patient denies sore throat and sinus problems.  Hematologic/Lymphatic:   Patient denies swollen glands and easy bruising.  Cardiovascular:   Patient denies leg swelling and chest pains.  Respiratory:   Patient denies cough and shortness of breath.  Endocrine:   Patient denies excessive thirst.  Musculoskeletal:   Patient denies back pain and joint pain.  Neurological:   Patient denies headaches and dizziness.  Psychologic:   Patient denies depression and anxiety.    VITAL SIGNS: None    MULTI-SYSTEM PHYSICAL EXAMINATION:    Gastrointestinal: Suprapubic tube in place draining clear yellow urine      Complexity of Data:  Source Of History:  Patient  Records Review:   Previous Patient Records    PROCEDURES:         Simple Change SP Tube - 51705  The patient's indwelling SP tube was carefully removed. A 20 French Foley catheter was inserted into the bladder using sterile technique. The patient was taught routine catheter care. Hand irrigation of the bladder with sterile water was performed. A bedside bag was connected. A Urinalysis Auto W/Scope was sent to the lab.    ASSESSMENT:      ICD-10 Details  1 GU:   Bladder, Neuromuscular dysfunction, Unspec - N31.9 Chronic, Stable  2   Unihibited neuropathic bladder -  N31.0 Chronic, Stable  3 NON-GU:   Other mechanical complication of cystostomy catheter, initial encounter - T83.090A Undiagnosed New Problem    PLAN:            Schedule Procedure: Unspecified Date at [facility] - [address] - Botox 200mg (Cystourethroscopy, W/Injection For Chemodenervation Of Bladder) - 52287, [id number]            Document Letter(s):  Created for Patient: Clinical Summary           Notes:   Schedule for repeat Botox. Continue suprapubic catheter.         Next Appointment:      Next Appointment: 12/20/2020 11:00 AM    Appointment Type: Nurse Visit NO Urine    Location: [facility] - [address]    Provider: NVPodA NVPodA    Reason for Visit: 1 mo cath        Signed by Salvator Seppala, III, M.D. on 12/07/20 at 10:10 AM (EDT 

## 2020-12-25 NOTE — Anesthesia Procedure Notes (Signed)
Procedure Name: LMA Insertion Date/Time: 12/25/2020 7:42 AM Performed by: Lucinda Dell, CRNA Pre-anesthesia Checklist: Patient identified, Emergency Drugs available, Patient being monitored and Suction available Patient Re-evaluated:Patient Re-evaluated prior to induction Oxygen Delivery Method: Circle system utilized Preoxygenation: Pre-oxygenation with 100% oxygen Induction Type: IV induction Ventilation: Mask ventilation without difficulty LMA: LMA inserted LMA Size: 3.0 Tube type: Oral Number of attempts: 1 Placement Confirmation: positive ETCO2 and breath sounds checked- equal and bilateral Tube secured with: Tape Dental Injury: Teeth and Oropharynx as per pre-operative assessment

## 2020-12-25 NOTE — Anesthesia Postprocedure Evaluation (Signed)
Anesthesia Post Note  Patient: Kathryn Crawford  Procedure(s) Performed: CYSTOSCOPY RIGHT URETEROSCOPY/, RETROGRADE, HOLMIUM LASER/STENT PLACEMENT (Right: Renal) HOLMIUM LASER APPLICATION (Right: Renal)     Patient location during evaluation: PACU Anesthesia Type: General Level of consciousness: awake and alert Pain management: pain level controlled Vital Signs Assessment: post-procedure vital signs reviewed and stable Respiratory status: spontaneous breathing, nonlabored ventilation, respiratory function stable and patient connected to nasal cannula oxygen Cardiovascular status: blood pressure returned to baseline and stable Postop Assessment: no apparent nausea or vomiting Anesthetic complications: no   No notable events documented.  Last Vitals:  Vitals:   12/25/20 0920 12/25/20 0930  BP:  124/82  Pulse: 65 62  Resp: 20 17  Temp:  (!) 36.3 C  SpO2: 100% 97%    Last Pain:  Vitals:   12/25/20 1144  TempSrc:   PainSc: 0-No pain                 Earl Lites P Kaige Whistler

## 2020-12-26 ENCOUNTER — Emergency Department (HOSPITAL_COMMUNITY)
Admission: EM | Admit: 2020-12-26 | Discharge: 2020-12-26 | Disposition: A | Discharge status: Home / Self Care | Payer: No Typology Code available for payment source | Source: Home / Self Care | Attending: Emergency Medicine | Admitting: Emergency Medicine

## 2020-12-26 ENCOUNTER — Encounter (HOSPITAL_BASED_OUTPATIENT_CLINIC_OR_DEPARTMENT_OTHER): Payer: Self-pay | Admitting: Urology

## 2020-12-26 ENCOUNTER — Emergency Department (HOSPITAL_COMMUNITY): Payer: No Typology Code available for payment source

## 2020-12-26 DIAGNOSIS — Z8616 Personal history of COVID-19: Secondary | ICD-10-CM | POA: Insufficient documentation

## 2020-12-26 DIAGNOSIS — N9489 Other specified conditions associated with female genital organs and menstrual cycle: Secondary | ICD-10-CM | POA: Insufficient documentation

## 2020-12-26 DIAGNOSIS — R109 Unspecified abdominal pain: Secondary | ICD-10-CM | POA: Insufficient documentation

## 2020-12-26 DIAGNOSIS — G8918 Other acute postprocedural pain: Secondary | ICD-10-CM | POA: Insufficient documentation

## 2020-12-26 DIAGNOSIS — Z87891 Personal history of nicotine dependence: Secondary | ICD-10-CM | POA: Insufficient documentation

## 2020-12-26 LAB — CBC WITH DIFFERENTIAL/PLATELET
Abs Immature Granulocytes: 0.05 10*3/uL (ref 0.00–0.07)
Basophils Absolute: 0.1 10*3/uL (ref 0.0–0.1)
Basophils Relative: 0 %
Eosinophils Absolute: 0.1 10*3/uL (ref 0.0–0.5)
Eosinophils Relative: 1 %
HCT: 37.9 % (ref 36.0–46.0)
Hemoglobin: 13.1 g/dL (ref 12.0–15.0)
Immature Granulocytes: 0 %
Lymphocytes Relative: 18 %
Lymphs Abs: 2.4 10*3/uL (ref 0.7–4.0)
MCH: 29.5 pg (ref 26.0–34.0)
MCHC: 34.6 g/dL (ref 30.0–36.0)
MCV: 85.4 fL (ref 80.0–100.0)
Monocytes Absolute: 1 10*3/uL (ref 0.1–1.0)
Monocytes Relative: 7 %
Neutro Abs: 9.7 10*3/uL — ABNORMAL HIGH (ref 1.7–7.7)
Neutrophils Relative %: 74 %
Platelets: 368 10*3/uL (ref 150–400)
RBC: 4.44 MIL/uL (ref 3.87–5.11)
RDW: 13 % (ref 11.5–15.5)
WBC: 13.2 10*3/uL — ABNORMAL HIGH (ref 4.0–10.5)
nRBC: 0 % (ref 0.0–0.2)

## 2020-12-26 LAB — COMPREHENSIVE METABOLIC PANEL
ALT: 21 U/L (ref 0–44)
AST: 27 U/L (ref 15–41)
Albumin: 4 g/dL (ref 3.5–5.0)
Alkaline Phosphatase: 53 U/L (ref 38–126)
Anion gap: 10 (ref 5–15)
BUN: 23 mg/dL — ABNORMAL HIGH (ref 6–20)
CO2: 25 mmol/L (ref 22–32)
Calcium: 10.6 mg/dL — ABNORMAL HIGH (ref 8.9–10.3)
Chloride: 100 mmol/L (ref 98–111)
Creatinine, Ser: 1.25 mg/dL — ABNORMAL HIGH (ref 0.44–1.00)
GFR, Estimated: 55 mL/min — ABNORMAL LOW (ref >60)
Glucose, Bld: 120 mg/dL — ABNORMAL HIGH (ref 70–99)
Potassium: 3.5 mmol/L (ref 3.5–5.1)
Sodium: 135 mmol/L (ref 135–145)
Total Bilirubin: 0.4 mg/dL (ref 0.3–1.2)
Total Protein: 7.2 g/dL (ref 6.5–8.1)

## 2020-12-26 LAB — URINALYSIS, ROUTINE W REFLEX MICROSCOPIC
Bacteria, UA: NONE SEEN
Bilirubin Urine: NEGATIVE
Glucose, UA: NEGATIVE mg/dL
Ketones, ur: NEGATIVE mg/dL
Nitrite: NEGATIVE
Protein, ur: 100 mg/dL — AB
RBC / HPF: >50 RBC/hpf — ABNORMAL HIGH (ref 0–5)
Specific Gravity, Urine: 1.017 (ref 1.005–1.030)
WBC, UA: >50 WBC/hpf — ABNORMAL HIGH (ref 0–5)
pH: 6 (ref 5.0–8.0)

## 2020-12-26 LAB — I-STAT BETA HCG BLOOD, ED (MC, WL, AP ONLY): I-stat hCG, quantitative: <5 m[IU]/mL (ref <5)

## 2020-12-26 MED ORDER — KETOROLAC TROMETHAMINE 30 MG/ML IJ SOLN
30.0000 mg | Freq: Once | INTRAMUSCULAR | Status: AC
  Administered 2020-12-26: 30 mg via INTRAVENOUS
  Filled 2020-12-26: qty 1

## 2020-12-26 MED ORDER — ACETAMINOPHEN 325 MG PO TABS
ORAL_TABLET | ORAL | Status: AC
  Filled 2020-12-26: qty 2

## 2020-12-26 MED ORDER — SODIUM CHLORIDE 0.9 % IV BOLUS
1000.0000 mL | Freq: Once | INTRAVENOUS | Status: AC
  Administered 2020-12-26: 1000 mL via INTRAVENOUS

## 2020-12-26 MED ORDER — SULFAMETHOXAZOLE-TRIMETHOPRIM 800-160 MG PO TABS: 1.0000 | ORAL_TABLET | Freq: Two times a day (BID) | ORAL | 1 refills | Status: DC

## 2020-12-26 MED ORDER — HYDROMORPHONE HCL 1 MG/ML IJ SOLN
1.0000 mg | Freq: Once | INTRAMUSCULAR | Status: AC
  Administered 2020-12-26: 1 mg via INTRAVENOUS
  Filled 2020-12-26: qty 1

## 2020-12-26 MED ORDER — HYDROCODONE-ACETAMINOPHEN 5-325 MG PO TABS: 1.0000 | ORAL_TABLET | ORAL | 0 refills | Status: AC | PRN

## 2020-12-26 MED ORDER — ONDANSETRON HCL 4 MG/2ML IJ SOLN
4.0000 mg | Freq: Once | INTRAMUSCULAR | Status: AC
  Administered 2020-12-26: 4 mg via INTRAVENOUS
  Filled 2020-12-26: qty 2

## 2020-12-26 MED ORDER — HYDROMORPHONE HCL 2 MG PO TABS: 1.0000 mg | ORAL_TABLET | Freq: Four times a day (QID) | ORAL | 0 refills | Status: DC | PRN

## 2020-12-26 MED ORDER — KETOROLAC TROMETHAMINE 30 MG/ML IJ SOLN: 30.0000 mg | Freq: Once | INTRAMUSCULAR | 0 refills | Status: AC

## 2020-12-26 MED ORDER — FLUCONAZOLE 150 MG PO TABS: 150.0000 mg | ORAL_TABLET | Freq: Every day | ORAL | 2 refills | Status: DC

## 2020-12-26 MED ORDER — TAMSULOSIN HCL 0.4 MG PO CAPS: 0.4000 mg | ORAL_CAPSULE | Freq: Every day | ORAL | 0 refills | Status: AC

## 2020-12-26 NOTE — ED Provider Notes (Signed)
Emergency Department Provider Note   I have reviewed the triage vital signs and the nursing notes.   HISTORY  Chief Complaint Post-op Problem   HPI Kathryn Spelman is a 40 y.o. female with PMH reviewed below presents to the ED with sudden, severe pain in the right flank. Patient had a cystoscopy with right retrograde pyelogram w/ lithotripsy and stent placement with Dr. Alvester Morin yesterday. She was doing well and ultimately discharged but developed acute onset pain at around 3 PM. She is not having fever or chills. No dysuria, hesitancy, or urgency. Pain radiates to the right anterior abdomen. Pain is severe with no modifying factors.   Past Medical History:  Diagnosis Date   Anemia    Anxiety    COVID-19 08/2019   cough, headache, fever, all symptoms resolved per pt on 12/20/20   History of kidney stones    Pulmonary embolism (HCC) 08/2018   bilateral, pt states PEs were due to [facility], pt stated that she was on blood thinners for 6 months and embolisms resolved per 12/20/20 call with pt   Stress incontinence     Patient Active Problem List   Diagnosis Date Noted   Renal calculi 09/04/2020   Urinary tract obstruction by kidney stone 07/06/2020   Sepsis (HCC) 07/06/2020   Headache 07/06/2020   Urinary tract infection 09/20/2017   Hydronephrosis with obstructing calculus 09/19/2017    Past Surgical History:  Procedure Laterality Date   BREAST ENHANCEMENT SURGERY  2008   CYSTOSCOPY WITH RETROGRADE PYELOGRAM, URETEROSCOPY AND STENT PLACEMENT Left 07/06/2020   Procedure: CYSTOSCOPY WITH RETROGRADE PYELOGRAM, URETEROSCOPY AND STENT PLACEMENT;  Surgeon: Crista Elliot, MD;  Location: [facility];  Service: Urology;  Laterality: Left;   CYSTOSCOPY WITH STENT PLACEMENT Left 09/19/2017   Procedure: CYSTOSCOPY WITH STENT PLACEMENT;  Surgeon: Ihor Gully, MD;  Location: [facility];  Service: Urology;  Laterality: Left;   CYSTOSCOPY/URETEROSCOPY/HOLMIUM LASER/STENT PLACEMENT Right  12/25/2020   Procedure: CYSTOSCOPY RIGHT URETEROSCOPY/, RETROGRADE, HOLMIUM LASER/STENT PLACEMENT;  Surgeon: Crista Elliot, MD;  Location: [facility];  Service: Urology;  Laterality: Right;   EXTRACORPOREAL SHOCK WAVE LITHOTRIPSY Left 10/16/2017   Procedure: LEFT EXTRACORPOREAL SHOCK WAVE LITHOTRIPSY (ESWL);  Surgeon: Ihor Gully, MD;  Location: [facility];  Service: Urology;  Laterality: Left;   HOLMIUM LASER APPLICATION Right 12/25/2020   Procedure: HOLMIUM LASER APPLICATION;  Surgeon: Crista Elliot, MD;  Location[facility];  Service: Urology;  Laterality: Right;   IR URETERAL STENT LEFT NEW ACCESS W/O SEP NEPHROSTOMY CATH  09/04/2020   NEPHROLITHOTOMY Left 09/04/2020   Procedure: LEFT NEPHROLITHOTOMY PERCUTANEOUS POSSIBLE URETEROSCOPY WITH STENT PLACEMENT;  Surgeon: Crista Elliot, MD;  Location[facility];  Service: Urology;  Laterality: Left;  REQUESTING 2 HRS   TUBAL LIGATION Bilateral 2006   WISDOM TOOTH EXTRACTION  2020   2 top wisdom teeth removed    Allergies Patient has no known allergies.  Family History  Problem Relation Age of Onset   Breast cancer Maternal Grandmother    Breast cancer Paternal Grandmother     Social History Social History   Tobacco Use   Smoking status: Former    Packs/day: 1.00    Years: 10.00    Pack years: 10.00    Types: Cigarettes    Quit date: 2005    Years since quitting: 17.8   Smokeless tobacco: Never  Vaping Use   Vaping Use: Never used  Substance Use Topics  Alcohol use: Yes    Comment: 1 - 2 glasses of wine per week   Drug use: No    Review of Systems  Constitutional: No fever/chills Eyes: No visual changes. ENT: No sore throat. Cardiovascular: Denies chest pain. Respiratory: Denies shortness of breath. Gastrointestinal: Positive right flank/abdominal pain.  No nausea, no vomiting.  No diarrhea.  No constipation. Genitourinary: Negative for dysuria. Musculoskeletal: Negative for  back pain. Skin: Negative for rash. Neurological: Negative for headaches, focal weakness or numbness.  10-point ROS otherwise negative.  ____________________________________________   PHYSICAL EXAM:  VITAL SIGNS: ED Triage Vitals  Enc Vitals Group     BP 12/26/20 1709 (!) 145/92     Pulse Rate 12/26/20 1709 78     Resp 12/26/20 1709 18     Temp 12/26/20 1709 97.9 F (36.6 C)     Temp Source 12/26/20 1709 Oral     SpO2 12/26/20 1709 100 %   Constitutional: Alert and oriented. Patient sitting up in bed and appearing to be in severe pain. Frequent shifting in bed with grimacing.  Eyes: Conjunctivae are normal.  Head: Atraumatic. Nose: No congestion/rhinnorhea. Mouth/Throat: Mucous membranes are moist.  Neck: No stridor.  Cardiovascular: Normal rate, regular rhythm. Good peripheral circulation. Grossly normal heart sounds.   Respiratory: Normal respiratory effort.  No retractions. Lungs CTAB. Gastrointestinal: Soft and nontender. No distention.  Musculoskeletal: No lower extremity tenderness nor edema. No gross deformities of extremities. Neurologic:  Normal speech and language. No gross focal neurologic deficits are appreciated.  Skin:  Skin is warm, dry and intact. No rash noted.  ____________________________________________   LABS (all labs ordered are listed, but only abnormal results are displayed)  Labs Reviewed  COMPREHENSIVE METABOLIC PANEL - Abnormal; Notable for the following components:      Result Value   Glucose, Bld 120 (*)    BUN 23 (*)    Creatinine, Ser 1.25 (*)    Calcium 10.6 (*)    GFR, Estimated 55 (*)    All other components within normal limits  CBC WITH DIFFERENTIAL/PLATELET - Abnormal; Notable for the following components:   WBC 13.2 (*)    Neutro Abs 9.7 (*)    All other components within normal limits  URINALYSIS, ROUTINE W REFLEX MICROSCOPIC - Abnormal; Notable for the following components:   APPearance CLOUDY (*)    Hgb urine dipstick  LARGE (*)    Protein, ur 100 (*)    Leukocytes,Ua LARGE (*)    RBC / HPF >50 (*)    WBC, UA >50 (*)    All other components within normal limits  I-STAT BETA HCG BLOOD, ED ([facility], [facility], [facility] ONLY)   ____________________________________________  RADIOLOGY  DG Abdomen 1 View  Result Date: 12/26/2020 CLINICAL DATA:  Ureteral stent placed yesterday.  Right flank pain. EXAM: ABDOMEN - 1 VIEW COMPARISON:  11/14/2020 FINDINGS: Right ureteral stent in place. Previously seen right renal stones not definitively seen. Calcifications in the pelvis likely phleboliths. Moderate stool burden in the colon. No bowel obstruction or free air. IMPRESSION: Right ureteral stent in place. Moderate stool burden. Electronically Signed   By: Charlett Nose M.D.   On: 12/26/2020 17:48    ____________________________________________   PROCEDURES  Procedure(s) performed:   Procedures   ____________________________________________   INITIAL IMPRESSION / ASSESSMENT AND PLAN / ED COURSE  Pertinent labs & imaging results that were available during my care of the patient were reviewed by me and considered in my medical decision making (see chart  for details).   Patient presents to the emergency department with acute onset severe right flank pain postop day 1 status post lithotripsy with ureteral stent placed on the right.  She is afebrile with normal vital signs other than mildly elevated blood pressure, likely pain related.  Labs and abdominal imaging ordered during the MSE process along with pain medication.  We will follow this imaging and discuss with urology.   Spoke with Dr. Alvester Morin and reviewed labs and imaging here. He will have her continue her abx at home and will discharge her home with flomax and Dilaudid Rx. Will see her in the office tomorrow. She has a follow up appointment tomorrow. [place] drug database reviewed.  ____________________________________________  FINAL CLINICAL IMPRESSION(S) / ED DIAGNOSES  Final  diagnoses:  Right flank pain     MEDICATIONS GIVEN DURING THIS VISIT:  Medications  sodium chloride 0.9 % bolus 1,000 mL (0 mLs Intravenous Stopped 12/26/20 1956)  ondansetron (ZOFRAN) injection 4 mg (4 mg Intravenous Given 12/26/20 1828)  HYDROmorphone (DILAUDID) injection 1 mg (1 mg Intravenous Given 12/26/20 1829)  ketorolac (TORADOL) 30 MG/ML injection 30 mg (30 mg Intravenous Given 12/26/20 2011)     NEW OUTPATIENT MEDICATIONS STARTED DURING THIS VISIT:  New Prescriptions   HYDROMORPHONE (DILAUDID) 2 MG TABLET    Take 0.5 tablets (1 mg total) by mouth every 6 (six) hours as needed for severe pain.   KETOROLAC (TORADOL) 30 MG/ML INJECTION    Inject 1 mL (30 mg total) into the vein once for 1 dose.   TAMSULOSIN (FLOMAX) 0.4 MG CAPS CAPSULE    Take 1 capsule (0.4 mg total) by mouth daily for 7 days.    Note:  This document was prepared using Dragon voice recognition software and may include unintentional dictation errors.  Alona Bene, MD, [facility] Emergency Medicine    Kyel Purk, Arlyss Repress, MD 12/26/20 2036

## 2020-12-26 NOTE — ED Provider Notes (Signed)
MSE was initiated and I personally evaluated the patient and placed orders (if any) at  5:25 PM on December 26, 2020.  The patient appears stable so that the remainder of the MSE may be completed by another provider.  Patient presents 1 day after right ureteral stent with lithotripsy now with right flank, right-sided abdominal pain.  She was well on discharge, but about 2 hours ago developed pain.   Gerhard Munch, MD 12/26/20 1726

## 2020-12-26 NOTE — Discharge Instructions (Signed)
Please keep your urology appointment tomorrow. Continue your antibiotic.  I have called in some additional pain medication but you cannot take this along with your other pain medicines.  Only take as needed for severe pain.  Can also cause constipation.  Please take over-the-counter constipation medication to prevent this side effect.  Return to the emergency department with any worsening pain or fever.  Please follow with your urologist as instructed.

## 2020-12-26 NOTE — ED Triage Notes (Signed)
Pt presents with c/o right flank pain after having a kidney stone removed and stent placed yesterday. Pt stayed overnight and went home this morning but began having excruciating pain approx 3 pm from right flank to abdomen.

## 2020-12-26 NOTE — Progress Notes (Signed)
Spoke with Dr. Jacqulyn Bath  Patient had some severe pain today with vomiting.  Pain is gotten better with Dilaudid.  She is afebrile with vital signs stable.  I reviewed her KUB.  Looks like the stent may have migrated down some.  I am going to get her in my office tomorrow at 1245 and if she is still having significant pain and discomfort I am just going to go ahead and pull the stent.  May benefit from stronger pain medication with a few Dilaudid to get her through to tomorrow when I see her at 37.  Can also send home with 0.4 mg of tamsulosin which sometimes helps with stent discomfort.

## 2020-12-26 NOTE — Discharge Summary (Signed)
Physician Discharge Summary  Patient ID: Kathryn Crawford MRN: [id number] DOB/AGE: 1980-04-24 40 y.o.  Admit date: 12/25/2020 Discharge date: 12/26/2020  Admission Diagnoses:  Discharge Diagnoses:  Active Problems:   Renal calculi   Discharged Condition: good  Hospital Course: Patient underwent a right ureteroscopy with laser lithotripsy and ureteral stent placement.  She tolerated the procedure well was stable postoperative.  She remained under observation given her history of severe sepsis following renal stone surgery in the recent past.  Following day she had been afebrile with vital signs stable with minimal complaints.  She was discharged in stable condition.  Consults: None  Significant Diagnostic Studies: None  Treatments: surgery: As above  Discharge Exam: Blood pressure 119/82, pulse 78, temperature 97.6 F (36.4 C), resp. rate 18, height 5\' 9"  (1.753 m), weight 82.1 kg, last menstrual period 12/01/2020, SpO2 100 %. General appearance: alert no acute distress Adequate perfusion of extremities Abdomen soft nontender nondistended Nonlabored respiration  Disposition: Discharge disposition: 01-Home or Self Care       Discharge Instructions     No wound care   Complete by: As directed       Allergies as of 12/26/2020   No Known Allergies      Medication List     TAKE these medications    acetaminophen 500 MG tablet Commonly known as: TYLENOL Take 1,000 mg by mouth every 6 (six) hours as needed for mild pain, fever or headache. Notes to patient: Can take a dose at 12:30pm   fluconazole 150 MG tablet Commonly known as: DIFLUCAN Take 1 tablet (150 mg total) by mouth daily.   HYDROcodone-acetaminophen 5-325 MG tablet Commonly known as: NORCO/VICODIN Take 1 tablet by mouth every 4 (four) hours as needed for severe pain. What changed: Another medication with the same name was added. Make sure you understand how and when to take each.    HYDROcodone-acetaminophen 5-325 MG tablet Commonly known as: NORCO/VICODIN Take 1 tablet by mouth every 4 (four) hours as needed for moderate pain. What changed: You were already taking a medication with the same name, and this prescription was added. Make sure you understand how and when to take each.   indapamide 2.5 MG tablet Commonly known as: LOZOL Take 2.5 mg by mouth daily.   ondansetron 4 MG tablet Commonly known as: ZOFRAN Take 4 mg by mouth every 8 (eight) hours as needed for nausea or vomiting.   sulfamethoxazole-trimethoprim 800-160 MG tablet Commonly known as: BACTRIM DS Take 1 tablet by mouth 2 (two) times daily. Started on 12/15/2020 and will finish on 12/21/2020. What changed: Another medication with the same name was added. Make sure you understand how and when to take each.   sulfamethoxazole-trimethoprim 800-160 MG tablet Commonly known as: BACTRIM DS Take 1 tablet by mouth 2 (two) times daily. What changed: You were already taking a medication with the same name, and this prescription was added. Make sure you understand how and when to take each. Notes to patient: Take a dose this morning 11/15   triamcinolone cream 0.1 % Commonly known as: KENALOG Apply 1 application topically daily as needed (eczema).         Signed: 12/15, III 12/26/2020, 7:43 PM

## 2021-06-07 ENCOUNTER — Other Ambulatory Visit (HOSPITAL_COMMUNITY)
Admission: RE | Admit: 2021-06-07 | Discharge: 2021-06-07 | Disposition: A | Discharge status: Home / Self Care | Payer: No Typology Code available for payment source | Source: Ambulatory Visit | Attending: Nurse Practitioner | Admitting: Nurse Practitioner

## 2021-06-07 ENCOUNTER — Other Ambulatory Visit: Payer: Self-pay | Admitting: Nurse Practitioner

## 2021-06-07 DIAGNOSIS — Z124 Encounter for screening for malignant neoplasm of cervix: Secondary | ICD-10-CM | POA: Diagnosis present

## 2021-06-14 LAB — CYTOLOGY - PAP
Comment: NEGATIVE
Comment: NEGATIVE
Comment: NEGATIVE
HPV 16: NEGATIVE
HPV 18 / 45: NEGATIVE
High risk HPV: POSITIVE — AB

## 2021-07-13 ENCOUNTER — Other Ambulatory Visit: Payer: Self-pay | Admitting: Obstetrics and Gynecology

## 2021-10-17 ENCOUNTER — Other Ambulatory Visit: Payer: Self-pay | Admitting: Obstetrics and Gynecology

## 2021-11-23 ENCOUNTER — Other Ambulatory Visit: Payer: Self-pay | Admitting: Obstetrics and Gynecology

## 2021-11-23 DIAGNOSIS — Z1231 Encounter for screening mammogram for malignant neoplasm of breast: Secondary | ICD-10-CM

## 2022-01-11 ENCOUNTER — Ambulatory Visit
Admission: RE | Admit: 2022-01-11 | Discharge: 2022-01-11 | Disposition: A | Discharge status: Home / Self Care | Payer: No Typology Code available for payment source | Source: Ambulatory Visit | Attending: Obstetrics and Gynecology | Admitting: Obstetrics and Gynecology

## 2022-01-11 DIAGNOSIS — Z1231 Encounter for screening mammogram for malignant neoplasm of breast: Secondary | ICD-10-CM

## 2022-03-07 ENCOUNTER — Ambulatory Visit (INDEPENDENT_AMBULATORY_CARE_PROVIDER_SITE_OTHER): Payer: No Typology Code available for payment source

## 2022-03-07 ENCOUNTER — Ambulatory Visit: Payer: No Typology Code available for payment source | Admitting: Podiatry

## 2022-03-07 DIAGNOSIS — M722 Plantar fascial fibromatosis: Secondary | ICD-10-CM

## 2022-03-07 MED ORDER — METHYLPREDNISOLONE 4 MG PO TBPK: ORAL_TABLET | ORAL | 0 refills | Status: DC

## 2022-03-07 MED ORDER — TRIAMCINOLONE ACETONIDE 40 MG/ML IJ SUSP
40.0000 mg | Freq: Once | INTRAMUSCULAR | Status: AC
  Administered 2022-03-07: 40 mg

## 2022-03-07 MED ORDER — MELOXICAM 15 MG PO TABS: 15.0000 mg | ORAL_TABLET | Freq: Every day | ORAL | 3 refills | Status: DC

## 2022-03-07 NOTE — Progress Notes (Signed)
Subjective:  Patient ID: Kathryn Crawford, female    DOB: 1980-05-23,  MRN: [id number] HPI Chief Complaint  Patient presents with   Plantar Fasciitis    Bilateral x several months - pain is worse at the end of the day - tried good shoe gear, went to the good feet store - nothing is working very well    42 y.o. female presents with the above complaint.   ROS: Denies fever chills nausea vomit muscle aches pains calf pain back pain chest pain shortness of breath.  Past Medical History:  Diagnosis Date   Anemia    Anxiety    COVID-19 08/2019   cough, headache, fever, all symptoms resolved per pt on 12/20/20   History of kidney stones    Pulmonary embolism ([place]) 08/2018   bilateral, pt states PEs were due to [facility], pt stated that she was on blood thinners for 6 months and embolisms resolved per 12/20/20 call with pt   Stress incontinence    Past Surgical History:  Procedure Laterality Date   AUGMENTATION MAMMAPLASTY Bilateral 2008   BREAST ENHANCEMENT SURGERY  2008   CYSTOSCOPY WITH RETROGRADE PYELOGRAM, URETEROSCOPY AND STENT PLACEMENT Left 07/06/2020   Procedure: CYSTOSCOPY WITH RETROGRADE PYELOGRAM, URETEROSCOPY AND STENT PLACEMENT;  Surgeon: Lucas Mallow, MD;  [facility];  Service: Urology;  Laterality: Left;   CYSTOSCOPY WITH STENT PLACEMENT Left 09/19/2017   Procedure: CYSTOSCOPY WITH STENT PLACEMENT;  Surgeon: Kathie Rhodes, MD;  [facility];  Service: Urology;  Laterality: Left;   CYSTOSCOPY/URETEROSCOPY/HOLMIUM LASER/STENT PLACEMENT Right 12/25/2020   Procedure: CYSTOSCOPY RIGHT URETEROSCOPY/, RETROGRADE, HOLMIUM LASER/STENT PLACEMENT;  Surgeon: Lucas Mallow, MD;  Location: [facility];  Service: Urology;  Laterality: Right;   EXTRACORPOREAL SHOCK WAVE LITHOTRIPSY Left 10/16/2017   Procedure: LEFT EXTRACORPOREAL SHOCK WAVE LITHOTRIPSY (ESWL);  Surgeon: Kathie Rhodes, MD;  Location[facility];  Service: Urology;  Laterality: Left;    HOLMIUM LASER APPLICATION Right [id number]/[id number]   Procedure: HOLMIUM LASER APPLICATION;  Surgeon: Lucas Mallow, MD;  Location: [facility];  Service: Urology;  Laterality: Right;   IR URETERAL STENT LEFT NEW ACCESS W/O SEP NEPHROSTOMY CATH  09/04/2020   NEPHROLITHOTOMY Left 09/04/2020   Procedure: LEFT NEPHROLITHOTOMY PERCUTANEOUS POSSIBLE URETEROSCOPY WITH STENT PLACEMENT;  Surgeon: Lucas Mallow, MD;  [facility];  Service: Urology;  Laterality: Left;  REQUESTING 2 HRS   TUBAL LIGATION Bilateral 2006   WISDOM TOOTH EXTRACTION  2020   2 top wisdom teeth removed    Current Outpatient Medications:    meloxicam (MOBIC) 15 MG tablet, Take 1 tablet (15 mg total) by mouth daily., Disp: 30 tablet, Rfl: 3   methylPREDNISolone (MEDROL DOSEPAK) 4 MG TBPK tablet, 6 day dose pack - take as directed, Disp: 21 tablet, Rfl: 0   acetaminophen (TYLENOL) 500 MG tablet, Take 1,000 mg by mouth every 6 (six) hours as needed for mild pain, fever or headache., Disp: , Rfl:    aspirin-acetaminophen-caffeine (EXCEDRIN MIGRAINE) 250-250-65 MG tablet, Take 1 tablet by mouth daily as needed for migraine., Disp: , Rfl:    fluconazole (DIFLUCAN) 150 MG tablet, Take 1 tablet (150 mg total) by mouth daily., Disp: 1 tablet, Rfl: 2   HYDROcodone-acetaminophen (NORCO/VICODIN) 5-325 MG tablet, Take 1 tablet by mouth every 4 (four) hours as needed for severe pain., Disp: 12 tablet, Rfl: 0   HYDROmorphone (DILAUDID) 2 MG tablet, Take 0.5 tablets (1 mg total) by mouth every 6 (six) hours  as needed for severe pain., Disp: 10 tablet, Rfl: 0   ibuprofen (ADVIL) 200 MG tablet, Take 800 mg by mouth every 6 (six) hours as needed for moderate pain., Disp: , Rfl:    indapamide (LOZOL) 2.5 MG tablet, Take 2.5 mg by mouth daily., Disp: , Rfl:    ketorolac (TORADOL) 10 MG tablet, Take 10 mg by mouth every 6 (six) hours as needed for moderate pain., Disp: , Rfl:    sulfamethoxazole-trimethoprim (BACTRIM DS) 800-160 MG  tablet, Take 1 tablet by mouth 2 (two) times daily. Started on 12/15/2020 and will finish on 12/21/2020., Disp: 20 tablet, Rfl: 0   sulfamethoxazole-trimethoprim (BACTRIM DS) 800-160 MG tablet, Take 1 tablet by mouth 2 (two) times daily. (Patient not taking: No sig reported), Disp: 20 tablet, Rfl: 1   triamcinolone cream (KENALOG) 0.1 %, Apply 1 application topically daily as needed (eczema)., Disp: , Rfl:   No Known Allergies Review of Systems Objective:  There were no vitals filed for this visit.  General: Well developed, nourished, in no acute distress, alert and oriented x3   Dermatological: Skin is warm, dry and supple bilateral. Nails x 10 are well maintained; remaining integument appears unremarkable at this time. There are no open sores, no preulcerative lesions, no rash or signs of infection present.  Vascular: Dorsalis Pedis artery and Posterior Tibial artery pedal pulses are 2/4 bilateral with immedate capillary fill time. Pedal hair growth present. No varicosities and no lower extremity edema present bilateral.   Neruologic: Grossly intact via light touch bilateral. Vibratory intact via tuning fork bilateral. Protective threshold with Semmes Wienstein monofilament intact to all pedal sites bilateral. Patellar and Achilles deep tendon reflexes 2+ bilateral. No Babinski or clonus noted bilateral.   Musculoskeletal: No gross boney pedal deformities bilateral. No pain, crepitus, or limitation noted with foot and ankle range of motion bilateral. Muscular strength 5/5 in all groups tested bilateral.  Pain on palpation medial calcaneal tubercles bilateral left greater than right  Gait: Unassisted, Nonantalgic.    Radiographs:  Radiographs taken today demonstrate osseously mature individual soft tissue increase in density plantar fascial kidney insertion sites minimal spurring is noted posteriorly none plantarly.  Assessment & Plan:   Assessment: Planter fasciitis proximal medial band  bilateral  Plan: Start her on methylprednisolone to be followed by meloxicam injected the bilateral heels today with Kenalog 20 mg Kenalog bilateral.  Discussed appropriate shoe gear follow-up with her in 1 month     Snigdha Howser T. [place], [place]

## 2022-03-07 NOTE — Patient Instructions (Signed)

## 2022-04-17 ENCOUNTER — Other Ambulatory Visit: Payer: Self-pay | Admitting: Obstetrics and Gynecology

## 2022-04-17 ENCOUNTER — Other Ambulatory Visit (HOSPITAL_COMMUNITY)
Admission: RE | Admit: 2022-04-17 | Discharge: 2022-04-17 | Disposition: A | Discharge status: Home / Self Care | Payer: No Typology Code available for payment source | Source: Ambulatory Visit | Attending: Obstetrics and Gynecology | Admitting: Obstetrics and Gynecology

## 2022-04-17 DIAGNOSIS — N871 Moderate cervical dysplasia: Secondary | ICD-10-CM | POA: Insufficient documentation

## 2022-04-22 LAB — CYTOLOGY - PAP
Comment: NEGATIVE
Comment: NEGATIVE
Comment: NEGATIVE
HPV 16: NEGATIVE
HPV 18 / 45: NEGATIVE
High risk HPV: POSITIVE — AB

## 2022-05-07 ENCOUNTER — Other Ambulatory Visit: Payer: Self-pay

## 2022-05-07 ENCOUNTER — Emergency Department (HOSPITAL_COMMUNITY)
Admission: EM | Admit: 2022-05-07 | Discharge: 2022-05-08 | Disposition: A | Discharge status: Home / Self Care | Payer: PRIVATE HEALTH INSURANCE | Attending: Emergency Medicine | Admitting: Emergency Medicine

## 2022-05-07 ENCOUNTER — Telehealth: Payer: Self-pay | Admitting: Neurology

## 2022-05-07 ENCOUNTER — Encounter: Payer: Self-pay | Admitting: Neurology

## 2022-05-07 ENCOUNTER — Ambulatory Visit: Payer: No Typology Code available for payment source | Admitting: Neurology

## 2022-05-07 VITALS — BP 120/84 | HR 102 | Ht 69.0 in | Wt 182.0 lb

## 2022-05-07 DIAGNOSIS — N179 Acute kidney failure, unspecified: Secondary | ICD-10-CM | POA: Diagnosis not present

## 2022-05-07 DIAGNOSIS — R519 Headache, unspecified: Secondary | ICD-10-CM | POA: Insufficient documentation

## 2022-05-07 DIAGNOSIS — I10 Essential (primary) hypertension: Secondary | ICD-10-CM | POA: Insufficient documentation

## 2022-05-07 LAB — CBC WITH DIFFERENTIAL/PLATELET
Abs Immature Granulocytes: 0.03 10*3/uL (ref 0.00–0.07)
Basophils Absolute: 0.1 10*3/uL (ref 0.0–0.1)
Basophils Relative: 1 %
Eosinophils Absolute: 0.3 10*3/uL (ref 0.0–0.5)
Eosinophils Relative: 3 %
HCT: 37.7 % (ref 36.0–46.0)
Hemoglobin: 12.9 g/dL (ref 12.0–15.0)
Immature Granulocytes: 0 %
Lymphocytes Relative: 36 %
Lymphs Abs: 3.8 10*3/uL (ref 0.7–4.0)
MCH: 30.1 pg (ref 26.0–34.0)
MCHC: 34.2 g/dL (ref 30.0–36.0)
MCV: 87.9 fL (ref 80.0–100.0)
Monocytes Absolute: 0.9 10*3/uL (ref 0.1–1.0)
Monocytes Relative: 9 %
Neutro Abs: 5.4 10*3/uL (ref 1.7–7.7)
Neutrophils Relative %: 51 %
Platelets: 322 10*3/uL (ref 150–400)
RBC: 4.29 MIL/uL (ref 3.87–5.11)
RDW: 12.2 % (ref 11.5–15.5)
WBC: 10.6 10*3/uL — ABNORMAL HIGH (ref 4.0–10.5)
nRBC: 0 % (ref 0.0–0.2)

## 2022-05-07 LAB — I-STAT BETA HCG BLOOD, ED (MC, WL, AP ONLY): I-stat hCG, quantitative: <5 m[IU]/mL (ref <5)

## 2022-05-07 MED ORDER — SODIUM CHLORIDE 0.9 % IV BOLUS
1000.0000 mL | Freq: Once | INTRAVENOUS | Status: AC
  Administered 2022-05-07: 1000 mL via INTRAVENOUS

## 2022-05-07 MED ORDER — DEXAMETHASONE SODIUM PHOSPHATE 10 MG/ML IJ SOLN
10.0000 mg | Freq: Once | INTRAMUSCULAR | Status: AC
  Administered 2022-05-07: 10 mg via INTRAVENOUS
  Filled 2022-05-07: qty 1

## 2022-05-07 MED ORDER — METOCLOPRAMIDE HCL 5 MG/ML IJ SOLN
10.0000 mg | Freq: Once | INTRAMUSCULAR | Status: AC
  Administered 2022-05-07: 10 mg via INTRAVENOUS
  Filled 2022-05-07: qty 2

## 2022-05-07 MED ORDER — METHYLPREDNISOLONE 4 MG PO TBPK: ORAL_TABLET | ORAL | 0 refills | Status: AC

## 2022-05-07 MED ORDER — OXYCODONE-ACETAMINOPHEN 5-325 MG PO TABS
1.0000 | ORAL_TABLET | ORAL | Status: DC | PRN
  Administered 2022-05-07: 1 via ORAL
  Filled 2022-05-07: qty 1

## 2022-05-07 NOTE — Telephone Encounter (Signed)
[employer] sent to [facility] they obtain auth [id number] 

## 2022-05-07 NOTE — ED Provider Notes (Incomplete)
[facility] Provider Note   CSN: [id number] Arrival date & time: 05/07/22  2207     History {Add pertinent medical, surgical, social history, OB history to HPI:1} Chief Complaint  Patient presents with  . Headache    Kathryn Crawford is a 42 y.o. female.  Patient presents to the emergency department complaining of a headache.  Patient states she has had consistent headaches which began approximately 10 days ago.  Patient states that she flew back from [place] last Sunday (March 17).  She reports that just prior to landing she began having a severe headache, with hot flashes and nausea.  She felt like she may pass out.  The headache has continued since that time.  It is described as dull which is worse with changing of positions.  The patient reports using over-the-counter medications such as Tylenol and Excedrin with no relief at home.  She did [place] history of similar headaches.  She denies migraine history.  She states she has had previous tension headaches and the over-the-counter medications normally help manage these.  She denies vision changes, focal deficit, Weakness, numbness.  She was evaluated earlier today by [facility] neurology who recommended that she get CT had without contrast followed by MRI MRA if needed.  The patient also reports that she was seen by her primary care last week and it was noted that her calcium was critically high and she had an acute kidney injury.  She has been drinking fluids at home and had a liter of fluid yesterday.  Past medical history otherwise significant for history of PE, hypertension, anxiety, headaches, kidney stones HPI     Home Medications Prior to Admission medications   Medication Sig Start Date End Date Taking? Authorizing Provider  acetaminophen (TYLENOL) 500 MG tablet Take 1,000 mg by mouth every 6 (six) hours as needed for mild pain, fever or headache.    [provider]   aspirin-acetaminophen-caffeine (EXCEDRIN MIGRAINE) [phone] MG tablet Take 1 tablet by mouth daily as needed for migraine.    [provider]  Biotin 5000 MCG CAPS Take 1 capsule by mouth daily.    [provider]  doxycycline (VIBRA-TABS) 100 MG tablet Take 100 mg by mouth 2 (two) times daily. 05/02/22   [provider]  indapamide (LOZOL) 2.5 MG tablet Take 2.5 mg by mouth 2 (two) times daily.    [provider]  Magnesium Glycinate 100 MG CAPS Take 2 tablets by mouth daily.    [provider]  methylPREDNISolone (MEDROL DOSEPAK) 4 MG TBPK tablet Take 6 tablets (24 mg total) by mouth daily for 1 day, THEN 5 tablets (20 mg total) daily for 1 day, THEN 4 tablets (16 mg total) daily for 1 day, THEN 3 tablets (12 mg total) daily for 1 day, THEN 2 tablets (8 mg total) daily for 1 day, THEN 1 tablet (4 mg total) daily for 1 day. 05/07/22 05/13/22  Alric Ran, MD  triamcinolone cream (KENALOG) 0.1 % Apply 1 application topically daily as needed (eczema).    [provider]  Vitamin D-Vitamin K (VITAMIN K2-VITAMIN D3 PO) Take 1 tablet by mouth daily.    [provider]      Allergies    Patient has no known allergies.    Review of Systems   Review of Systems  Constitutional:  Negative for fever.  Eyes:  Negative for visual disturbance.  Neurological:  Positive for headaches.  Physical Exam Updated Vital Signs BP (!) 147/99 (BP Location: Right Arm)   Pulse (!) 117   Temp 98.6 F (37 C) (Oral)   Resp 20   LMP 04/18/2022 (Exact Date)   SpO2 100%  Physical Exam Vitals and nursing note reviewed.  Constitutional:      General: She is not in acute distress.    Appearance: She is well-developed.  HENT:     Head: Normocephalic and atraumatic.  Eyes:     Extraocular Movements: Extraocular movements intact.     Conjunctiva/sclera: Conjunctivae normal.     Pupils: Pupils are equal, round, and reactive to light. Pupils are  equal.  Cardiovascular:     Rate and Rhythm: Normal rate and regular rhythm.     Heart sounds: No murmur heard. Pulmonary:     Effort: Pulmonary effort is normal. No respiratory distress.     Breath sounds: Normal breath sounds.  Abdominal:     Palpations: Abdomen is soft.     Tenderness: There is no abdominal tenderness.  Musculoskeletal:        General: No swelling.     Cervical back: Neck supple.  Skin:    General: Skin is warm and dry.     Capillary Refill: Capillary refill takes less than 2 seconds.  Neurological:     Mental Status: She is alert.     Comments: No focal deficits noted  Psychiatric:        Mood and Affect: Mood normal.     ED Results / Procedures / Treatments   Labs (all labs ordered are listed, but only abnormal results are displayed) Labs Reviewed  BASIC METABOLIC PANEL  CBC WITH DIFFERENTIAL/PLATELET  I-STAT BETA HCG BLOOD, ED ([facility], [facility], [facility] ONLY)    EKG None  Radiology No results found.  Procedures Procedures  {Document cardiac monitor, telemetry assessment procedure when appropriate:1}  Medications Ordered in ED Medications  oxyCODONE-acetaminophen (PERCOCET/ROXICET) 5-325 MG per tablet 1 tablet (1 tablet Oral Given 05/07/22 2219)  sodium chloride 0.9 % bolus 1,000 mL (has no administration in time range)  metoCLOPramide (REGLAN) injection 10 mg (has no administration in time range)  dexamethasone (DECADRON) injection 10 mg (has no administration in time range)    ED Course/ Medical Decision Making/ A&P   {   Click here for ABCD2, HEART and other calculatorsREFRESH Note before signing :1}                          Medical Decision Making Amount and/or Complexity of Data Reviewed Labs: ordered. Radiology: ordered.  Risk Prescription drug management.   This patient presents to the ED for concern of headaches, this involves an extensive number of treatment options, and is a complaint that carries with it a high risk of complications  and morbidity.  The differential diagnosis includes intracranial abnormality, tension headache, migraines, others   Co morbidities that complicate the patient evaluation  Hypertension, history of pulmonary embolism, history of headaches   Additional history obtained:   External records from outside source obtained and reviewed including neurology notes from earlier today documenting a normal neurologic exam.  I also reviewed records from last week from [place] [facility] noting patient with a creatinine of 1.80 and calcium of 14   Lab Tests:  I Ordered, and personally interpreted labs.  The pertinent results include:  ***   Imaging Studies ordered:  I ordered imaging studies including ***  I independently visualized and interpreted  imaging which showed *** I agree with the radiologist interpretation   Cardiac Monitoring: / EKG:  The patient was maintained on a cardiac monitor.  I personally viewed and interpreted the cardiac monitored which showed an underlying rhythm of: ***   Consultations Obtained:  I requested consultation with the ***,  and discussed lab and imaging findings as well as pertinent plan - they recommend: ***   Problem List / ED Course / Critical interventions / Medication management  *** I ordered medication including ***  for ***  Reevaluation of the patient after these medicines showed that the patient {resolved/improved/worsened:23923::"improved"} I have reviewed the patients home medicines and have made adjustments as needed   Social Determinants of Health:  ***   Test / Admission - Considered:  ***   {Document critical care time when appropriate:1} {Document review of labs and clinical decision tools ie heart score, Chads2Vasc2 etc:1}  {Document your independent review of radiology images, and any outside records:1} {Document your discussion with family members, caretakers, and with consultants:1} {Document social determinants of  health affecting pt's care:1} {Document your decision making why or why not admission, treatments were needed:1} Final Clinical Impression(s) / ED Diagnoses Final diagnoses:  None    Rx / DC Orders ED Discharge Orders     None

## 2022-05-07 NOTE — Progress Notes (Signed)
[facility]  PATIENT: Kathryn Crawford DOB: 1980/06/30  REQUESTING CLINICIAN: Pahwani, Michell Heinrich, MD HISTORY FROM: Patient  REASON FOR VISIT: New onset headaches    HISTORICAL  CHIEF COMPLAINT:  Chief Complaint  Patient presents with   New Patient (Initial Visit)    Rm 15 alone Headaches consistent one since last Sunday  Pt has questions regarding brain aneurisms she is an rn with [facility]    HISTORY OF PRESENT ILLNESS:  This is a 42 year old woman past medical history of PE, kidney stone, hypercalcemia and anxiety who is presenting with new onset headaches since March 17 for total of 10 days. Patient reports on March 17 she was flying back from [place] and right before the plane landed she started having severe headache, she got really hot and nauseous.  She felt like she was going to pass out.  When she landed she did not seek immediate medical attention.  The headache continued to happen all week.  She described the headaches as dull headache that get worse with changing position.  She has tried Tylenol and Excedrin with no relief.  She did not have this type of headache in the past, denies any previous history of migraines.  In the past she reported her tension headaches were well-controlled with Tylenol. She denies any change in her vision, denies any focal deficit, no weakness no numbness.  She does work as a Marine scientist in labor and delivery but again has not seek any medical attention, did not go to the ED for the past week. Headaches are constant.       OTHER MEDICAL CONDITIONS: PE, Kidney stone, anxiety    REVIEW OF SYSTEMS: Full 14 system review of systems performed and negative with exception of: As noted in the HPI   ALLERGIES: No Known Allergies  HOME MEDICATIONS: Outpatient Medications Prior to Visit  Medication Sig Dispense Refill   acetaminophen (TYLENOL) 500 MG tablet Take 1,000 mg by mouth every 6 (six) hours as needed for  mild pain, fever or headache.     aspirin-acetaminophen-caffeine (EXCEDRIN MIGRAINE) 250-250-65 MG tablet Take 1 tablet by mouth daily as needed for migraine.     Biotin 5000 MCG CAPS Take 1 capsule by mouth daily.     doxycycline (VIBRA-TABS) 100 MG tablet Take 100 mg by mouth 2 (two) times daily.     indapamide (LOZOL) 2.5 MG tablet Take 2.5 mg by mouth 2 (two) times daily.     Magnesium Glycinate 100 MG CAPS Take 2 tablets by mouth daily.     triamcinolone cream (KENALOG) 0.1 % Apply 1 application topically daily as needed (eczema).     Vitamin D-Vitamin K (VITAMIN K2-VITAMIN D3 PO) Take 1 tablet by mouth daily.     fluconazole (DIFLUCAN) 150 MG tablet Take 1 tablet (150 mg total) by mouth daily. 1 tablet 2   HYDROcodone-acetaminophen (NORCO/VICODIN) 5-325 MG tablet Take 1 tablet by mouth every 4 (four) hours as needed for severe pain. 12 tablet 0   HYDROmorphone (DILAUDID) 2 MG tablet Take 0.5 tablets (1 mg total) by mouth every 6 (six) hours as needed for severe pain. 10 tablet 0   ibuprofen (ADVIL) 200 MG tablet Take 800 mg by mouth every 6 (six) hours as needed for moderate pain.     ketorolac (TORADOL) 10 MG tablet Take 10 mg by mouth every 6 (six) hours as needed for moderate pain.     meloxicam (MOBIC) 15 MG tablet Take 1 tablet (15  mg total) by mouth daily. 30 tablet 3   methylPREDNISolone (MEDROL DOSEPAK) 4 MG TBPK tablet 6 day dose pack - take as directed 21 tablet 0   sulfamethoxazole-trimethoprim (BACTRIM DS) 800-160 MG tablet Take 1 tablet by mouth 2 (two) times daily. Started on 12/15/2020 and will finish on 12/21/2020. 20 tablet 0   sulfamethoxazole-trimethoprim (BACTRIM DS) 800-160 MG tablet Take 1 tablet by mouth 2 (two) times daily. (Patient not taking: No sig reported) 20 tablet 1   No facility-administered medications prior to visit.    PAST MEDICAL HISTORY: Past Medical History:  Diagnosis Date   Anemia    Anxiety    COVID-19 08/2019   cough, headache, fever, all  symptoms resolved per pt on 12/20/20   Headache    History of kidney stones    Hypertension    Pulmonary embolism ([place]) 08/2018   bilateral, pt states PEs were due to [facility], pt stated that she was on blood thinners for 6 months and embolisms resolved per 12/20/20 call with pt   Stress incontinence    Syncope and collapse     PAST SURGICAL HISTORY: Past Surgical History:  Procedure Laterality Date   AUGMENTATION MAMMAPLASTY Bilateral 2008   BREAST ENHANCEMENT SURGERY  2008   CYSTOSCOPY WITH RETROGRADE PYELOGRAM, URETEROSCOPY AND STENT PLACEMENT Left 07/06/2020   Procedure: CYSTOSCOPY WITH RETROGRADE PYELOGRAM, URETEROSCOPY AND STENT PLACEMENT;  Surgeon: Lucas Mallow, MD;  [facility];  Service: Urology;  Laterality: Left;   CYSTOSCOPY WITH STENT PLACEMENT Left 09/19/2017   Procedure: CYSTOSCOPY WITH STENT PLACEMENT;  Surgeon: Kathie Rhodes, MD;  Location[facility];  Service: Urology;  Laterality: Left;   CYSTOSCOPY/URETEROSCOPY/HOLMIUM LASER/STENT PLACEMENT Right 12/25/2020   Procedure: CYSTOSCOPY RIGHT URETEROSCOPY/, RETROGRADE, HOLMIUM LASER/STENT PLACEMENT;  Surgeon: Lucas Mallow, MD;  Location: [facility];  Service: Urology;  Laterality: Right;   EXTRACORPOREAL SHOCK WAVE LITHOTRIPSY Left 10/16/2017   Procedure: LEFT EXTRACORPOREAL SHOCK WAVE LITHOTRIPSY (ESWL);  Surgeon: Kathie Rhodes, MD;  Location: [facility];  Service: Urology;  Laterality: Left;   HOLMIUM LASER APPLICATION Right [id number]   Procedure: HOLMIUM LASER APPLICATION;  Surgeon: Lucas Mallow, MD;  [facility];  Service: Urology;  Laterality: Right;   IR URETERAL STENT LEFT NEW ACCESS W/O SEP NEPHROSTOMY CATH  09/04/2020   NEPHROLITHOTOMY Left 09/04/2020   Procedure: LEFT NEPHROLITHOTOMY PERCUTANEOUS POSSIBLE URETEROSCOPY WITH STENT PLACEMENT;  Surgeon: Lucas Mallow, MD;  Location: [facility];  Service: Urology;  Laterality: Left;  REQUESTING 2 HRS   TUBAL LIGATION  Bilateral 2006   WISDOM TOOTH EXTRACTION  2020   2 top wisdom teeth removed    FAMILY HISTORY: Family History  Problem Relation Age of Onset   Lung cancer Father    Graves' disease Brother    Schizophrenia Brother    Depression Paternal Uncle    Breast cancer Maternal Grandmother    Alzheimer's disease Maternal Grandmother    Breast cancer Paternal Grandmother     SOCIAL HISTORY: Social History   Socioeconomic History   Marital status: Married    Spouse name: Not on file   Number of children: 3   Years of education: Not on file   Highest education level: Not on file  Occupational History   Not on file  Tobacco Use   Smoking status: Former    Packs/day: 1.00    Years: 10.00    Additional pack years: 0.00    Total pack years: 10.00  Types: Cigarettes    Quit date: 2005    Years since quitting: 19.2   Smokeless tobacco: Never  Vaping Use   Vaping Use: Never used  Substance and Sexual Activity   Alcohol use: Yes    Alcohol/week: 1.0 standard drink of alcohol    Types: 1 Glasses of wine per week    Comment: 1 - 2 glasses of wine per week   Drug use: No   Sexual activity: Yes  Other Topics Concern   Not on file  Social History Narrative   Not on file   Social Determinants of Health   Financial Resource Strain: Not on file  Food Insecurity: Not on file  Transportation Needs: Not on file  Physical Activity: Not on file  Stress: Not on file  Social Connections: Not on file  Intimate Partner Violence: Not on file    PHYSICAL EXAM  GENERAL EXAM/CONSTITUTIONAL: Vitals:  Vitals:   05/07/22 1504  BP: 120/84  Pulse: (!) 102  Weight: 182 lb (82.6 kg)  Height: 5\' 9"  (1.753 m)   Body mass index is 26.88 kg/m. Wt Readings from Last 3 Encounters:  05/07/22 182 lb (82.6 kg)  12/25/20 181 lb 1.6 oz (82.1 kg)  09/04/20 191 lb 9.6 oz (86.9 kg)   Patient is in no distress; well developed, nourished and groomed; neck is supple  EYES: Pupils round and  reactive to light, Visual fields full to confrontation, Extraocular movements intacts,   MUSCULOSKELETAL: Gait, strength, tone, movements noted in Neurologic exam below  NEUROLOGIC: MENTAL STATUS:      No data to display         awake, alert, oriented to person, place and time recent and remote memory intact normal attention and concentration language fluent, comprehension intact, naming intact fund of knowledge appropriate  CRANIAL NERVE:  2nd - no papilledema or hemorrhages on fundoscopic exam 2nd, 3rd, 4th, 6th - pupils equal and reactive to light, visual fields full to confrontation, extraocular muscles intact, no nystagmus 5th - facial sensation symmetric 7th - facial strength symmetric 8th - hearing intact 9th - palate elevates symmetrically, uvula midline 11th - shoulder shrug symmetric 12th - tongue protrusion midline  MOTOR:  normal bulk and tone, full strength in the BUE, BLE  SENSORY:  normal and symmetric to light touch, pinprick  COORDINATION:  finger-nose-finger, fine finger movements normal  REFLEXES:  deep tendon reflexes present and symmetric  GAIT/STATION:  normal DIAGNOSTIC DATA (LABS, IMAGING, TESTING) - I reviewed patient records, labs, notes, testing and imaging myself where available.  Lab Results  Component Value Date   WBC 13.2 (H) 12/26/2020   HGB 13.1 12/26/2020   HCT 37.9 12/26/2020   MCV 85.4 12/26/2020   PLT 368 12/26/2020      Component Value Date/Time   NA 135 12/26/2020 1724   K 3.5 12/26/2020 1724   CL 100 12/26/2020 1724   CO2 25 12/26/2020 1724   GLUCOSE 120 (H) 12/26/2020 1724   BUN 23 (H) 12/26/2020 1724   CREATININE 1.25 (H) 12/26/2020 1724   CALCIUM 10.6 (H) 12/26/2020 1724   CALCIUM 9.4 11/12/2017 1550   PROT 7.2 12/26/2020 1724   ALBUMIN 4.0 12/26/2020 1724   AST 27 12/26/2020 1724   ALT 21 12/26/2020 1724   ALKPHOS 53 12/26/2020 1724   BILITOT 0.4 12/26/2020 1724   GFRNONAA 55 (L) 12/26/2020 1724    GFRAA >60 09/20/2017 0438   No results found for: "CHOL", "HDL", "LDLCALC", "LDLDIRECT", "TRIG", "CHOLHDL" No results  found for: "HGBA1C" No results found for: "VITAMINB12" No results found for: "TSH"     ASSESSMENT AND PLAN  42 y.o. year old female with history of PE, kidney stone, hypercalcemia, who is presenting with new onset headaches for the past 10 days.  Headache started when she was landing from a plane, they do get worse with position.  Again for the past 10 days she has not had any relief.  Tylenol or Excedrin Migraine have not been helpful.  Due to timing, inability to get a MRI right away we will start first with a CT head with no contrast.  If the CT head does not show any acute finding, then will order MRI MRA.  I will also give the patient a Medrol Dosepak.  This was discussed with the patient and she is comfortable with plans.   She lives in [place] but does work in [place], will try and obtain the [place] head tomorrow and later coordinate her MRI in [place] or [place].  She voices understanding.   Advised her to go to the nearest ED if she has worsening headache, change in her vision or double vision, any focal weakness, also new limited range of motion of her neck.  Again she voiced understanding.   1. New onset headache     Patient Instructions  Medrol Dosepak Due to wait time, will obtain first Stat CT head no contrast If no evidence of acute bleed, will obtain MRI/MRA. I will contact the patient to go over the results   Orders Placed This Encounter  Procedures   CT HEAD WO CONTRAST (5MM)    Meds ordered this encounter  Medications   methylPREDNISolone (MEDROL DOSEPAK) 4 MG TBPK tablet    Sig: Take 6 tablets (24 mg total) by mouth daily for 1 day, THEN 5 tablets (20 mg total) daily for 1 day, THEN 4 tablets (16 mg total) daily for 1 day, THEN 3 tablets (12 mg total) daily for 1 day, THEN 2 tablets (8 mg total) daily for 1 day, THEN 1 tablet (4 mg total)  daily for 1 day.    Dispense:  21 tablet    Refill:  0    No follow-ups on file.    Alric Ran, MD 05/07/2022, 5:35 PM  [facility] [address], [place], [place] [address] [phone]

## 2022-05-07 NOTE — ED Provider Notes (Signed)
[facility] Provider Note   CSN: [id number] Arrival date & time: 05/07/22  2207     History  Chief Complaint  Patient presents with   Headache    Kathryn Crawford is a 42 y.o. female.  Patient presents to the emergency department complaining of a headache.  Patient states she has had consistent headaches which began approximately 10 days ago.  Patient states that she flew back from [place] last Sunday (March 17).  She reports that just prior to landing she began having a severe headache, with hot flashes and nausea.  She felt like she may pass out.  The headache has continued since that time.  It is described as dull which is worse with changing of positions.  The patient reports using over-the-counter medications such as Tylenol and Excedrin with no relief at home.  She did [place] history of similar headaches.  She denies migraine history.  She states she has had previous tension headaches and the over-the-counter medications normally help manage these.  She denies vision changes, focal deficit, Weakness, numbness.  She was evaluated earlier today by [facility] neurology who recommended that she get CT had without contrast followed by MRI MRA if needed.  The patient also reports that she was seen by her primary care last week and it was noted that her calcium was critically high and she had an acute kidney injury.  She has been drinking fluids at home and had a liter of fluid yesterday.  Past medical history otherwise significant for history of PE, hypertension, anxiety, headaches, kidney stones HPI     Home Medications Prior to Admission medications   Medication Sig Start Date End Date Taking? Authorizing Provider  acetaminophen (TYLENOL) 500 MG tablet Take 1,000 mg by mouth every 6 (six) hours as needed for mild pain, fever or headache.    [provider]  aspirin-acetaminophen-caffeine (EXCEDRIN MIGRAINE) [phone] MG tablet  Take 1 tablet by mouth daily as needed for migraine.    [provider]  Biotin 5000 MCG CAPS Take 1 capsule by mouth daily.    [provider]  doxycycline (VIBRA-TABS) 100 MG tablet Take 100 mg by mouth 2 (two) times daily. 05/02/22   [provider]  indapamide (LOZOL) 2.5 MG tablet Take 2.5 mg by mouth 2 (two) times daily.    [provider]  Magnesium Glycinate 100 MG CAPS Take 2 tablets by mouth daily.    [provider]  methylPREDNISolone (MEDROL DOSEPAK) 4 MG TBPK tablet Take 6 tablets (24 mg total) by mouth daily for 1 day, THEN 5 tablets (20 mg total) daily for 1 day, THEN 4 tablets (16 mg total) daily for 1 day, THEN 3 tablets (12 mg total) daily for 1 day, THEN 2 tablets (8 mg total) daily for 1 day, THEN 1 tablet (4 mg total) daily for 1 day. 05/07/22 05/13/22  Alric Ran, MD  triamcinolone cream (KENALOG) 0.1 % Apply 1 application topically daily as needed (eczema).    [provider]  Vitamin D-Vitamin K (VITAMIN K2-VITAMIN D3 PO) Take 1 tablet by mouth daily.    [provider]      Allergies    Patient has no known allergies.    Review of Systems   Review of Systems  Constitutional:  Negative for fever.  Eyes:  Negative for visual disturbance.  Neurological:  Positive for headaches.    Physical Exam Updated Vital Signs BP 119/75  Pulse 85   Temp 98.7 F (37.1 C) (Oral)   Resp 12   LMP 04/18/2022 (Exact Date)   SpO2 96%  Physical Exam Vitals and nursing note reviewed.  Constitutional:      General: She is not in acute distress.    Appearance: She is well-developed.  HENT:     Head: Normocephalic and atraumatic.  Eyes:     Extraocular Movements: Extraocular movements intact.     Conjunctiva/sclera: Conjunctivae normal.     Pupils: Pupils are equal, round, and reactive to light. Pupils are equal.  Cardiovascular:     Rate and Rhythm: Normal rate and regular rhythm.     Heart sounds: No murmur  heard. Pulmonary:     Effort: Pulmonary effort is normal. No respiratory distress.     Breath sounds: Normal breath sounds.  Abdominal:     Palpations: Abdomen is soft.     Tenderness: There is no abdominal tenderness.  Musculoskeletal:        General: No swelling.     Cervical back: Neck supple.  Skin:    General: Skin is warm and dry.     Capillary Refill: Capillary refill takes less than 2 seconds.  Neurological:     Mental Status: She is alert.     Comments: No focal deficits noted  Psychiatric:        Mood and Affect: Mood normal.     ED Results / Procedures / Treatments   Labs (all labs ordered are listed, but only abnormal results are displayed) Labs Reviewed  BASIC METABOLIC PANEL - Abnormal; Notable for the following components:      Result Value   Sodium 134 (*)    Potassium 3.3 (*)    Glucose, Bld 127 (*)    BUN 33 (*)    Creatinine, Ser 1.67 (*)    Calcium 13.9 (*)    GFR, Estimated 39 (*)    All other components within normal limits  CBC WITH DIFFERENTIAL/PLATELET - Abnormal; Notable for the following components:   WBC 10.6 (*)    All other components within normal limits  I-STAT BETA HCG BLOOD, ED ([facility], [facility], [facility] ONLY)    EKG None  Radiology MR BRAIN WO CONTRAST  Result Date: 05/08/2022 CLINICAL DATA:  Headache EXAM: MRI HEAD WITHOUT CONTRAST MRA HEAD WITHOUT CONTRAST TECHNIQUE: Multiplanar, multi-echo pulse sequences of the brain and surrounding structures were acquired without intravenous contrast. Angiographic images of the Circle of Willis were acquired using MRA technique without intravenous contrast. COMPARISON:  None Available. FINDINGS: MRI HEAD FINDINGS Brain: No acute infarct, mass effect or extra-axial collection. No acute or chronic hemorrhage. Normal white matter signal, parenchymal volume and CSF spaces. The midline structures are normal. Vascular: Major flow voids are preserved. Skull and upper cervical spine: Normal calvarium and skull base.  Visualized upper cervical spine and soft tissues are normal. Sinuses/Orbits:No paranasal sinus fluid levels or advanced mucosal thickening. No mastoid or middle ear effusion. Normal orbits. MRA HEAD FINDINGS POSTERIOR CIRCULATION: --Vertebral arteries: Normal --Inferior cerebellar arteries: Normal. --Basilar artery: Normal. --Superior cerebellar arteries: Normal. --Posterior cerebral arteries: Normal. ANTERIOR CIRCULATION: --Intracranial internal carotid arteries: Normal. --Anterior cerebral arteries (ACA): Normal. --Middle cerebral arteries (MCA): Normal. IMPRESSION: Normal MRI and MRA of the brain. Electronically Signed   By: Ulyses Jarred M.D.   On: 05/08/2022 01:53   MR ANGIO HEAD WO CONTRAST  Result Date: 05/08/2022 CLINICAL DATA:  Headache EXAM: MRI HEAD WITHOUT CONTRAST MRA HEAD WITHOUT CONTRAST TECHNIQUE: Multiplanar, multi-echo  pulse sequences of the brain and surrounding structures were acquired without intravenous contrast. Angiographic images of the Circle of Willis were acquired using MRA technique without intravenous contrast. COMPARISON:  None Available. FINDINGS: MRI HEAD FINDINGS Brain: No acute infarct, mass effect or extra-axial collection. No acute or chronic hemorrhage. Normal white matter signal, parenchymal volume and CSF spaces. The midline structures are normal. Vascular: Major flow voids are preserved. Skull and upper cervical spine: Normal calvarium and skull base. Visualized upper cervical spine and soft tissues are normal. Sinuses/Orbits:No paranasal sinus fluid levels or advanced mucosal thickening. No mastoid or middle ear effusion. Normal orbits. MRA HEAD FINDINGS POSTERIOR CIRCULATION: --Vertebral arteries: Normal --Inferior cerebellar arteries: Normal. --Basilar artery: Normal. --Superior cerebellar arteries: Normal. --Posterior cerebral arteries: Normal. ANTERIOR CIRCULATION: --Intracranial internal carotid arteries: Normal. --Anterior cerebral arteries (ACA): Normal. --Middle  cerebral arteries (MCA): Normal. IMPRESSION: Normal MRI and MRA of the brain. Electronically Signed   By: Ulyses Jarred M.D.   On: 05/08/2022 01:53   MR MRV HEAD WO CM  Result Date: 05/08/2022 CLINICAL DATA:  Headache EXAM: MR VENOGRAM HEAD WITHOUT CONTRAST TECHNIQUE: Angiographic images of the intracranial venous structures were acquired using MRV technique without intravenous contrast. COMPARISON:  None Available. FINDINGS: Superior sagittal sinus: Normal. Straight sinus: Normal. Inferior sagittal sinus, vein of Galen and internal cerebral veins: Normal. Transverse sinuses: Normal. Sigmoid sinuses: Normal. Visualized jugular veins: Normal. IMPRESSION: Normal intracranial MRV. Electronically Signed   By: Ulyses Jarred M.D.   On: 05/08/2022 01:51   CT Head Wo Contrast  Result Date: 05/08/2022 CLINICAL DATA:  Headache, increasing frequency or severity EXAM: CT HEAD WITHOUT CONTRAST TECHNIQUE: Contiguous axial images were obtained from the base of the skull through the vertex without intravenous contrast. RADIATION DOSE REDUCTION: This exam was performed according to the departmental dose-optimization program which includes automated exposure control, adjustment of the mA and/or kV according to patient size and/or use of iterative reconstruction technique. COMPARISON:  MRI head 05/08/2022 FINDINGS: Brain: No evidence of large-territorial acute infarction. No parenchymal hemorrhage. No mass lesion. No extra-axial collection. No mass effect or midline shift. No hydrocephalus. Basilar cisterns are patent. Vascular: No hyperdense vessel. Skull: No acute fracture or focal lesion. Sinuses/Orbits: Paranasal sinuses and mastoid air cells are clear. The orbits are unremarkable. Other: None. IMPRESSION: No acute intracranial abnormality. Electronically Signed   By: Iven Finn M.D.   On: 05/08/2022 01:48    Procedures Procedures    Medications Ordered in ED Medications  oxyCODONE-acetaminophen  (PERCOCET/ROXICET) 5-325 MG per tablet 1 tablet (1 tablet Oral Given 05/07/22 2219)  LORazepam (ATIVAN) tablet 0.5 mg (has no administration in time range)  sodium chloride 0.9 % bolus 1,000 mL (0 mLs Intravenous Stopped 05/08/22 0200)  metoCLOPramide (REGLAN) injection 10 mg (10 mg Intravenous Given 05/07/22 2356)  dexamethasone (DECADRON) injection 10 mg (10 mg Intravenous Given 05/07/22 2356)    ED Course/ Medical Decision Making/ A&P                             Medical Decision Making Amount and/or Complexity of Data Reviewed Labs: ordered. Radiology: ordered.  Risk Prescription drug management.   This patient presents to the ED for concern of headaches, this involves an extensive number of treatment options, and is a complaint that carries with it a high risk of complications and morbidity.  The differential diagnosis includes intracranial abnormality, tension headache, migraines, others   Co morbidities that complicate the patient evaluation  Hypertension, history of pulmonary embolism, history of headaches   Additional history obtained:   External records from outside source obtained and reviewed including neurology notes from earlier today documenting a normal neurologic exam.  I also reviewed records from last week from [facility] noting patient with a creatinine of 1.80 and calcium of 14   Lab Tests:  I Ordered, and personally interpreted labs.  The pertinent results include: Calcium 13.9, creatinine 1.67 (slightly improved from 1.8 last week), negative i-STAT beta pregnancy test, unremarkable CBC  Imaging Studies ordered:  I ordered imaging studies including MRI brain, MR angio head, and MR MRV head; CT head without contrast I independently visualized and interpreted imaging which showed no acute intracranial abnormalities I agree with the radiologist interpretation   Cardiac Monitoring: / EKG:  The patient was maintained on a cardiac monitor.  I  personally viewed and interpreted the cardiac monitored which showed an underlying rhythm of: sinus rhythm   Problem List / ED Course / Critical interventions / Medication management   I ordered medication including Decadron and Reglan for headache Reevaluation of the patient after these medicines showed that the patient improved I have reviewed the patients home medicines and have made adjustments as needed   Test / Admission - Considered:  Patient's headache seems to be most likely a tension headache at this time.  She has no neurologic deficits.  Advanced imaging studies were negative for any acute abnormalities.  Her headache improved dramatically with the medication regimen.  Plan to have patient continue to follow-up with neurology for further evaluation and management of her headaches.  Patient continues to have an AKI and is hypercalcemic.  Patient mentions possibility of a parathyroid issue which may explain the hypercalcemia.  Her primary care checked her calcium last week and it was noted to be 14+ at that time.  This sounds like something that is more chronic.  Patient has no clinical manifestations of hypercalcemia at this time that I have noticed other than kidney impairment, which may or may not be due to the hypercalcemia.  Hypercalcemia is moderate in nature and seems to be mostly asymptomatic.  Currently discussed admission with the patient for further hydration and evaluation of her AKI and hypercalcemia but the patient declines at this time, stating she has a follow-up appointment with her primary team tomorrow, can continue to hydrate at home, and will have them check her parathyroid hormone.  This seems reasonable.  Return precautions provided.  Patient to discharge home at this time with plans for follow-up with both neurology and primary care        Final Clinical Impression(s) / ED Diagnoses Final diagnoses:  AKI (acute kidney injury) [facility])  Hypercalcemia  Bad  headache    Rx / DC Orders ED Discharge Orders     None         Ronny Bacon 05/08/22 0240    Quintella Reichert, MD 05/08/22 [phone]

## 2022-05-07 NOTE — ED Triage Notes (Signed)
Pt presents with persistent and worsening headache since 3/11.  Was seen at neuro and supposed to have CT tomorrow but they didn't call her today to schedule.  States she was told if pain persisted or worsened to come to the ED.  Pain is worse with movement.  Pressure and pain behind her eyes.

## 2022-05-07 NOTE — Telephone Encounter (Signed)
Pt called stated. She is at [facility] and the prescription for Prednisone haven't been sent to pharmacy.

## 2022-05-07 NOTE — Patient Instructions (Signed)
Medrol Dosepak Due to wait time, will obtain first Stat CT head no contrast If no evidence of acute bleed, will obtain MRI/MRA. I will contact the patient to go over the results

## 2022-05-08 ENCOUNTER — Emergency Department (HOSPITAL_COMMUNITY): Payer: PRIVATE HEALTH INSURANCE

## 2022-05-08 ENCOUNTER — Other Ambulatory Visit: Payer: Self-pay | Admitting: Neurology

## 2022-05-08 LAB — BASIC METABOLIC PANEL
Anion gap: 12 (ref 5–15)
BUN: 33 mg/dL — ABNORMAL HIGH (ref 6–20)
CO2: 24 mmol/L (ref 22–32)
Calcium: 13.9 mg/dL (ref 8.9–10.3)
Chloride: 98 mmol/L (ref 98–111)
Creatinine, Ser: 1.67 mg/dL — ABNORMAL HIGH (ref 0.44–1.00)
GFR, Estimated: 39 mL/min — ABNORMAL LOW (ref >60)
Glucose, Bld: 127 mg/dL — ABNORMAL HIGH (ref 70–99)
Potassium: 3.3 mmol/L — ABNORMAL LOW (ref 3.5–5.1)
Sodium: 134 mmol/L — ABNORMAL LOW (ref 135–145)

## 2022-05-08 MED ORDER — LORAZEPAM 1 MG PO TABS: 0.5000 mg | ORAL_TABLET | Freq: Once | ORAL | Status: DC | PRN

## 2022-05-08 NOTE — Discharge Instructions (Signed)
You were evaluated today for your headache.  The MRI scans and CT scan were negative for any acute intracranial abnormality.  Please continue to follow with neurology for further evaluation of your headaches. Your blood calcium levels were significantly elevated today.  You also have an acute kidney injury.  We discussed possible admission but you stated you would like to follow-up with your primary care team which seems reasonable.  Please be sure to continue to hydrate and to keep the appointment in the morning for further evaluation of these abnormal lab results.

## 2022-05-08 NOTE — Telephone Encounter (Signed)
Called and LVM per DPR, told her we have confirmation that [facility] [place] but pt also has [facility] in chart, informed her to call back if she wants other [facility]. Please ask which [facility] if patient returns call. Thanks

## 2022-05-13 ENCOUNTER — Other Ambulatory Visit: Payer: Self-pay | Admitting: Neurology

## 2022-05-13 MED ORDER — SUMATRIPTAN SUCCINATE 50 MG PO TABS: 50.0000 mg | ORAL_TABLET | ORAL | 3 refills | Status: AC | PRN

## 2022-05-13 MED ORDER — AMITRIPTYLINE HCL 25 MG PO TABS: 25.0000 mg | ORAL_TABLET | Freq: Every day | ORAL | 6 refills | Status: DC

## 2022-05-21 ENCOUNTER — Ambulatory Visit
Admission: RE | Admit: 2022-05-21 | Discharge: 2022-05-21 | Disposition: A | Discharge status: Home / Self Care | Payer: No Typology Code available for payment source | Source: Ambulatory Visit | Attending: Internal Medicine | Admitting: Internal Medicine

## 2022-05-21 ENCOUNTER — Other Ambulatory Visit: Payer: Self-pay | Admitting: Obstetrics and Gynecology

## 2022-05-21 ENCOUNTER — Other Ambulatory Visit: Payer: Self-pay | Admitting: Internal Medicine

## 2022-06-03 ENCOUNTER — Other Ambulatory Visit: Payer: Self-pay | Admitting: Internal Medicine

## 2022-06-03 DIAGNOSIS — R058 Other specified cough: Secondary | ICD-10-CM

## 2022-06-03 DIAGNOSIS — Z86711 Personal history of pulmonary embolism: Secondary | ICD-10-CM

## 2022-06-05 ENCOUNTER — Ambulatory Visit
Admission: RE | Admit: 2022-06-05 | Discharge: 2022-06-05 | Disposition: A | Discharge status: Home / Self Care | Payer: No Typology Code available for payment source | Source: Ambulatory Visit | Attending: Internal Medicine | Admitting: Internal Medicine

## 2022-06-05 DIAGNOSIS — Z86711 Personal history of pulmonary embolism: Secondary | ICD-10-CM

## 2022-06-05 DIAGNOSIS — R058 Other specified cough: Secondary | ICD-10-CM

## 2022-06-05 MED ORDER — IOPAMIDOL (ISOVUE-300) INJECTION 61%
60.0000 mL | Freq: Once | INTRAVENOUS | Status: AC | PRN
  Administered 2022-06-05: 60 mL via INTRAVENOUS

## 2022-06-13 IMAGING — CT CT ANGIO CHEST
2 of 7 series · 17 of 46 positions shown · IV contrast (omnipaque)
Comparison: CT abdomen/pelvis 07/06/2020

CLINICAL DATA: Two days of shortness of breath following kidney
stone removal surgery.

EXAM:
CT ANGIOGRAPHY CHEST WITH CONTRAST
TECHNIQUE: Multidetector CT imaging of the chest was performed using the
standard protocol during bolus administration of intravenous
contrast. Multiplanar CT image reconstructions and MIPs were
obtained to evaluate the vascular anatomy.
CONTRAST:  60mL OMNIPAQUE IOHEXOL 350 MG/ML SOLN

[Series 5: pe axial thins · axial · 0.75mm/px · z∈[+1228,+1500]mm · 14 of 314 slices shown]
[im 21/314  lung]
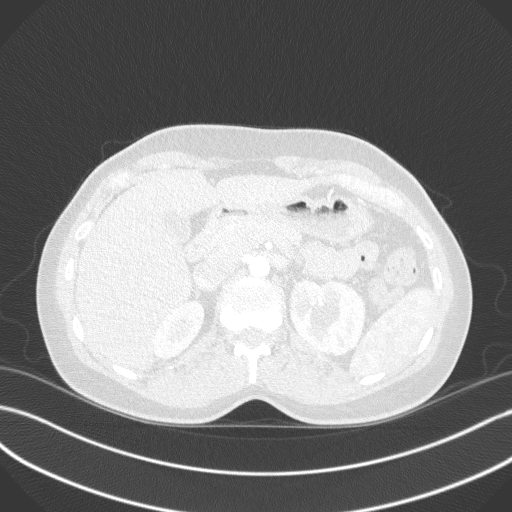
[im 42/314  soft-tissue]
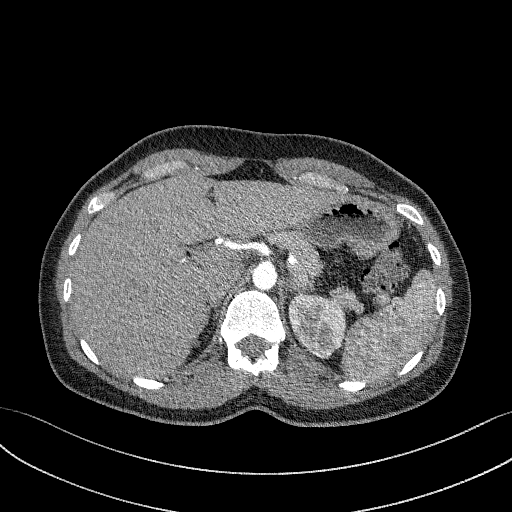
[im 63/314  lung]
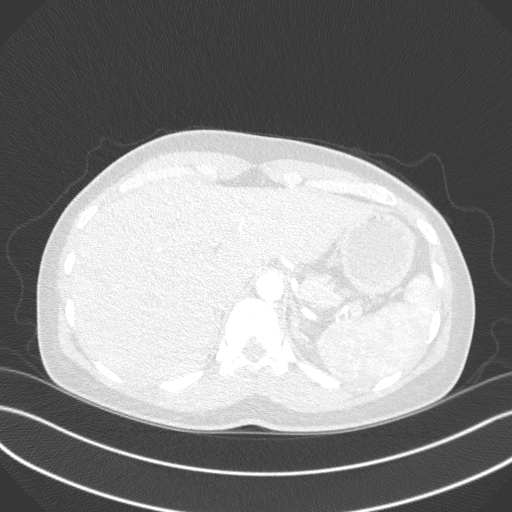
[im 84/314  soft-tissue]
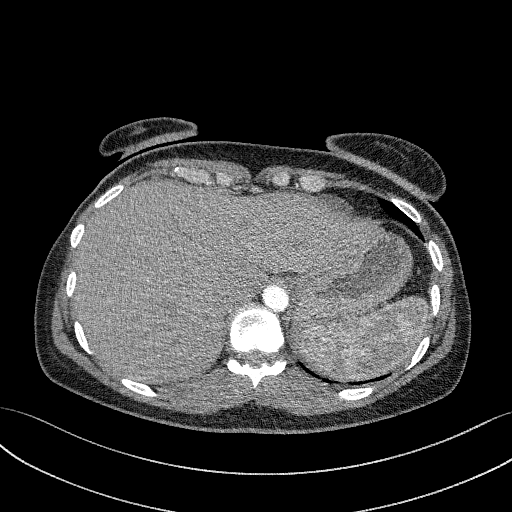
[im 105/314  lung]
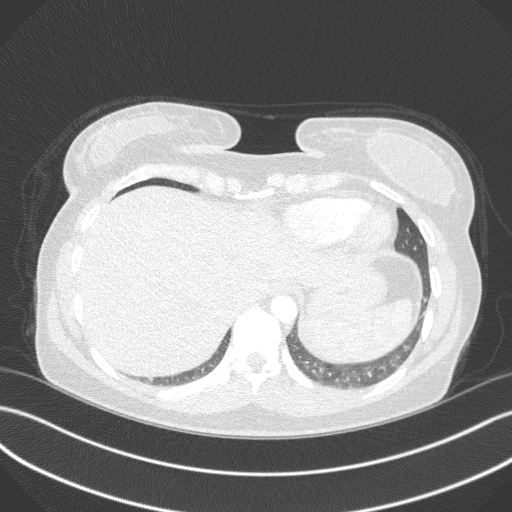
[im 126/314  soft-tissue]
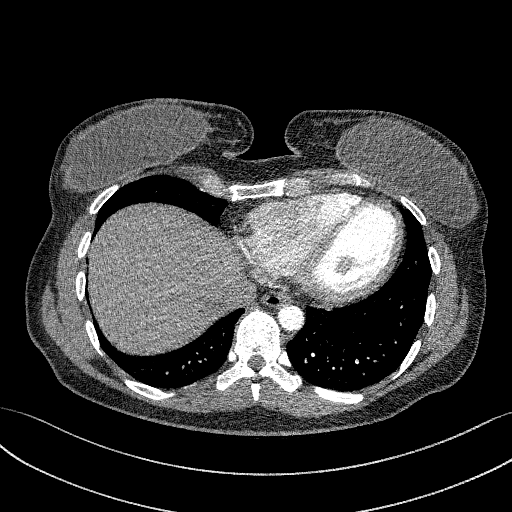
[im 147/314  lung]
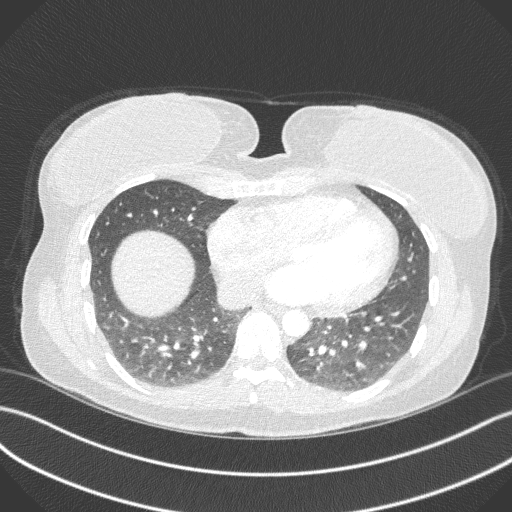
[im 167/314  soft-tissue]
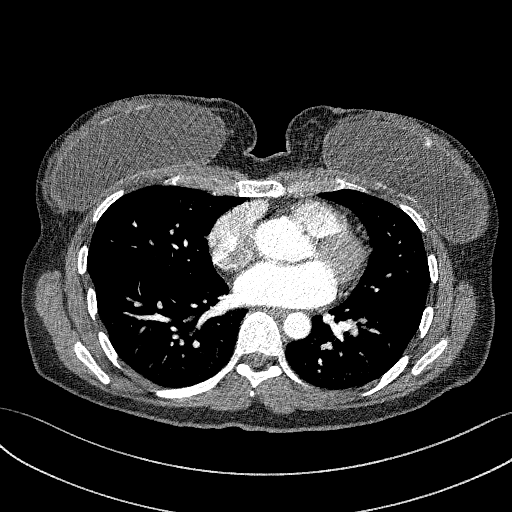
[im 188/314  lung]
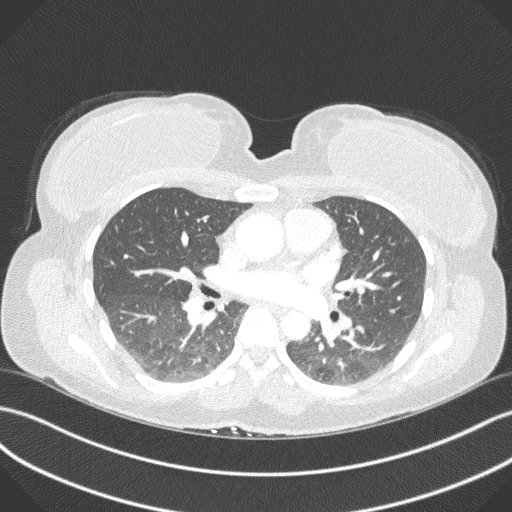
[im 209/314  soft-tissue]
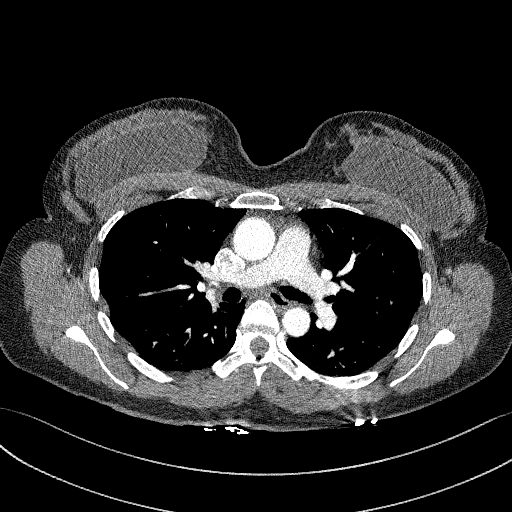
[im 230/314  lung]
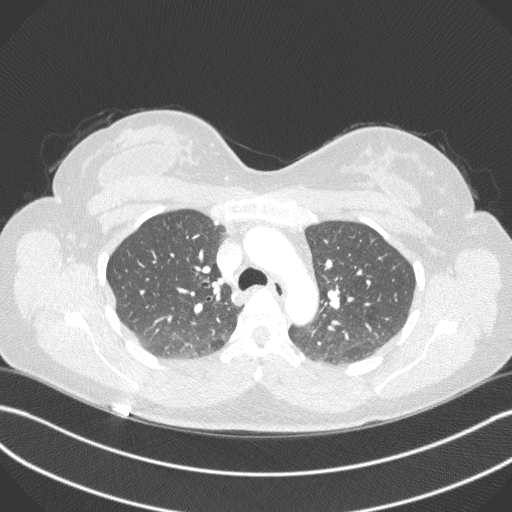
[im 251/314  soft-tissue]
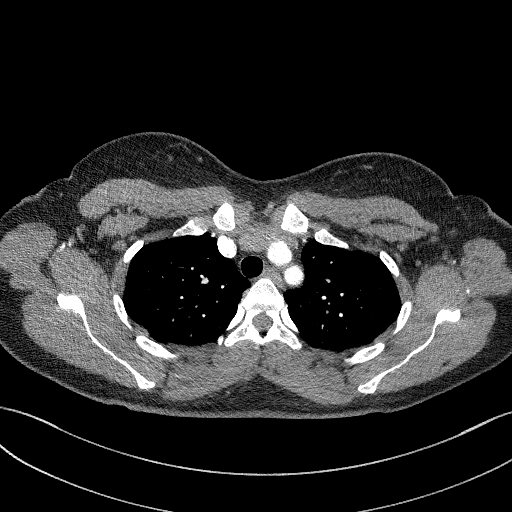
[im 272/314  lung]
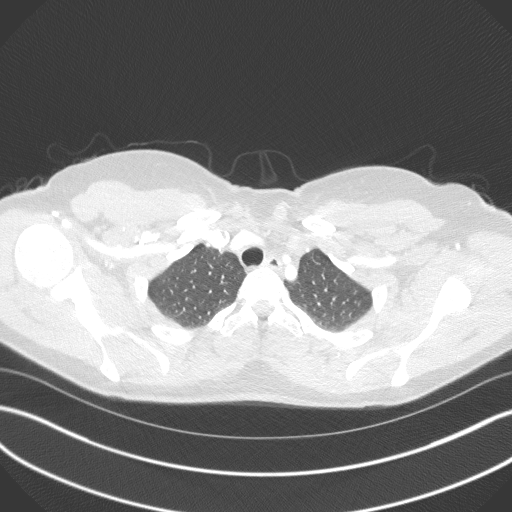
[im 293/314  soft-tissue]
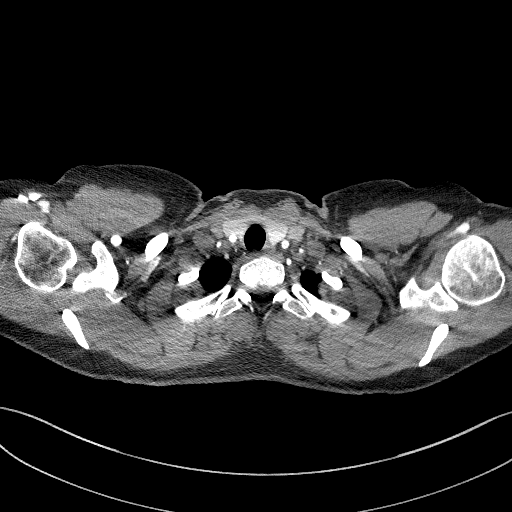

[Series 7: cor soft · coronal · 0.61mm/px · 3 of 140 slices shown]
[im 35/140  soft-tissue]
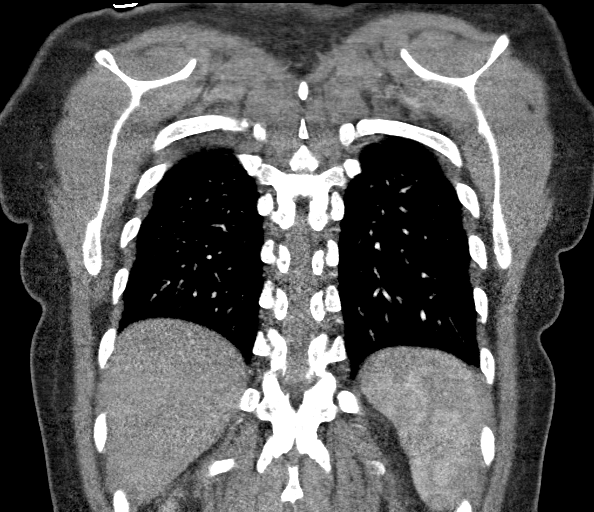
[im 70/140  soft-tissue]
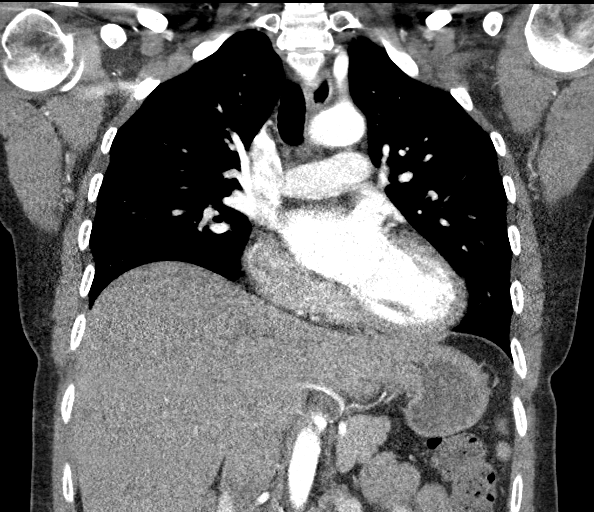
[im 105/140  soft-tissue]
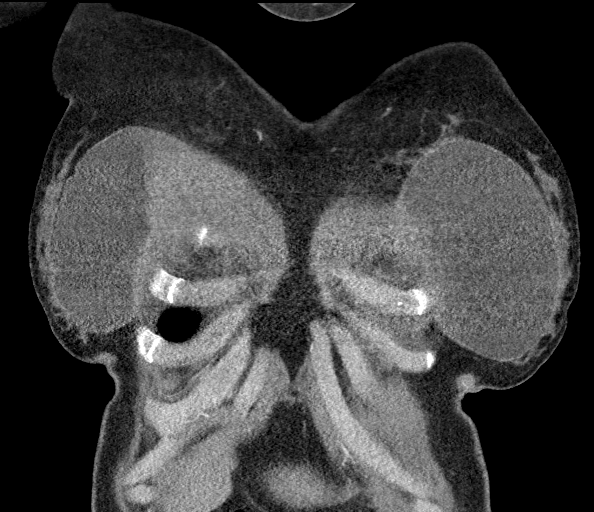

[17 of 46 positions shown; findings below may reference images not displayed]

FINDINGS: Cardiovascular: Limited evaluation of the pulmonary arteries
secondary to systemic arterial phase timing. No evidence of large
central or proximal lobar PE. Evaluation of the segmental pulmonary
arteries is limited. Two vessel aortic arch anatomy. The right
brachiocephalic and left common carotid artery share a common
origin. No evidence of aneurysm or dissection. The heart is normal
in size. No pericardial effusion.

Mediastinum/Nodes: Unremarkable CT appearance of the thyroid gland.
No suspicious mediastinal or hilar adenopathy. No soft tissue
mediastinal mass. The thoracic esophagus is unremarkable.

Lungs/Pleura: Lungs are clear. No pleural effusion or pneumothorax.

Upper Abdomen: Nephrolithiasis in the upper pole of the right
kidney. Mildly striated nephrogram of the left kidney is nonspecific
but likely representative of postsurgical edema. No acute
abnormality in the upper abdomen.

Musculoskeletal: No acute fracture or aggressive appearing lytic or
blastic osseous lesion.

Review of the MIP images confirms the above findings.
IMPRESSION: 1. No large central pulmonary embolus, pneumonia or other acute
cardiopulmonary process.
2. Limited evaluation of the distal lobar and segmental artery
secondary to systemic rather than pulmonary arterial phase timing of
the contrast bolus.
3. Striated nephrogram on the left likely reflects expected
postsurgical edema. In the appropriate clinical setting,
pyelonephritis could appear similar.
4. Stable right upper pole nephrolithiasis.

## 2022-06-14 ENCOUNTER — Other Ambulatory Visit: Payer: Self-pay | Admitting: Internal Medicine

## 2022-06-14 DIAGNOSIS — Z87442 Personal history of urinary calculi: Secondary | ICD-10-CM

## 2022-06-14 DIAGNOSIS — R82994 Hypercalciuria: Secondary | ICD-10-CM

## 2022-06-26 ENCOUNTER — Ambulatory Visit
Admission: RE | Admit: 2022-06-26 | Discharge: 2022-06-26 | Disposition: A | Discharge status: Home / Self Care | Payer: No Typology Code available for payment source | Source: Ambulatory Visit | Attending: Internal Medicine | Admitting: Internal Medicine

## 2022-06-26 ENCOUNTER — Ambulatory Visit: Payer: No Typology Code available for payment source | Admitting: Neurology

## 2022-06-26 DIAGNOSIS — Z87442 Personal history of urinary calculi: Secondary | ICD-10-CM

## 2022-06-26 DIAGNOSIS — R82994 Hypercalciuria: Secondary | ICD-10-CM

## 2022-06-26 MED ORDER — IOPAMIDOL (ISOVUE-300) INJECTION 61%
100.0000 mL | Freq: Once | INTRAVENOUS | Status: AC | PRN
  Administered 2022-06-26: 100 mL via INTRAVENOUS

## 2022-07-11 ENCOUNTER — Other Ambulatory Visit: Payer: Self-pay

## 2022-08-08 NOTE — Progress Notes (Signed)
[facility] Initial Visit:  Patient Care Team: Ollen Bowl, MD as PCP - General (Internal Medicine)  CHIEF COMPLAINTS/PURPOSE OF CONSULTATION:  HISTORY OF PRESENTING ILLNESS: Kathryn Crawford 42 y.o. female is here because of  hypercalcemia  Medical history notable for anemia, nephrolithiasis, hypertension, pulmonary embolism in July 2020 while on oral contraceptives, syncope.  May 07, 2022: WBC 10.0 hemoglobin 12.9 platelet count 322; 51 segs 36 lymphs 9 monos 3 eos.  Chemistry is notable for calcium 13.9 creatinine 1.67 CT head without contrast to evaluate headache.  No acute abnormality May 08, 2022: MRI angio/MRI of brain normal  May 20, 2022: SPEP showed no evidence of paraprotein.  1,25 dihydroxy vitamin D 113 (high) PTH RP negative.  PTH 9 (low) May 31, 2022: ACE level 71 (normal) June 04, 2022: HIV negative.  RPR negative June 05, 2022: CT chest no evidence of adenopathy or pulmonary disease  Jun 13, 2022: CRP less than 1 Jun 26, 2022 CT abdomen pelvis-notable for bilateral nephrolithiasis  July 25, 2022: QuantiFERON-TB gold negative  August 09 2022:  [place] [facility] In 2020 patient was initially diagnosed with nephrolithiasis.  In 2021 was found to have a bilateral staghorn calculi which had to be surgically removed.  In 2020 she she was diagnosed with pulmonary emboli which occurred 18 days after being placed on OCP's.  Has seen an Endocrinologist regarding the hypercalcemia and was placed prednisone and alendronate which has improved the hypercalcemia.   No family history of neprolithiasis Not taking Calcium, Vitamin D or any supplements  Social:  Horticulturist, commercial.  Tobacco none.  EtOH socially  [facility] Mother alive 77 does not see doctors Father died 15 lung cancer Brother alive 68 Grave's disease and mental illness.     Review of Systems  Constitutional:  Negative for chills, fatigue and fever.       Has  gained 10 to 15 lbs since starting prednisone  HENT:   Negative for lump/mass, mouth sores, nosebleeds, sore throat, trouble swallowing and voice change.   Eyes:  Negative for eye problems and icterus.       Vision changes:  None  Respiratory:  Negative for chest tightness, hemoptysis, shortness of breath and wheezing.        Slight cough.  Nonproductive  Cardiovascular:  Negative for chest pain and leg swelling.       Palpitations resolved with improvement of hypercalcemia  Gastrointestinal:  Negative for abdominal pain, blood in stool, constipation, diarrhea, nausea and vomiting.       Occasional heaviness LLQ  Endocrine: Negative for hot flashes.       Cold intolerance:  none Heat intolerance:  none  Genitourinary:  Negative for bladder incontinence, difficulty urinating, dysuria, frequency, hematuria and nocturia.   Musculoskeletal:  Negative for arthralgias, back pain, gait problem, myalgias, neck pain and neck stiffness.  Skin:  Negative for itching and wound.       Has eczema exacerbated by sweating  Neurological:  Negative for extremity weakness, gait problem, headaches, light-headedness, numbness, seizures and speech difficulty.  Hematological:  Negative for adenopathy. Does not bruise/bleed easily.  Psychiatric/Behavioral:  Negative for sleep disturbance and suicidal ideas. The patient is not nervous/anxious.     MEDICAL HISTORY: Past Medical History:  Diagnosis Date  . Anemia   . Anxiety   . COVID-19 08/2019   cough, headache, fever, all symptoms resolved per pt on 12/20/20  . Headache   . History of kidney  stones   . Hypertension   . Pulmonary embolism (HCC) 08/2018   bilateral, pt states PEs were due to [facility], pt stated that she was on blood thinners for 6 months and embolisms resolved per 12/20/20 call with pt  . Stress incontinence   . Syncope and collapse     SURGICAL HISTORY: Past Surgical History:  Procedure Laterality Date  . AUGMENTATION MAMMAPLASTY Bilateral  2008  . BREAST ENHANCEMENT SURGERY  2008  . CYSTOSCOPY WITH RETROGRADE PYELOGRAM, URETEROSCOPY AND STENT PLACEMENT Left 07/06/2020   Procedure: CYSTOSCOPY WITH RETROGRADE PYELOGRAM, URETEROSCOPY AND STENT PLACEMENT;  Surgeon: Crista Elliot, MD;  Location: [facility];  Service: Urology;  Laterality: Left;  . CYSTOSCOPY WITH STENT PLACEMENT Left 09/19/2017   Procedure: CYSTOSCOPY WITH STENT PLACEMENT;  Surgeon: Ihor Gully, MD;  Location: [facility];  Service: Urology;  Laterality: Left;  . CYSTOSCOPY/URETEROSCOPY/HOLMIUM LASER/STENT PLACEMENT Right 12/25/2020   Procedure: CYSTOSCOPY RIGHT URETEROSCOPY/, RETROGRADE, HOLMIUM LASER/STENT PLACEMENT;  Surgeon: Crista Elliot, MD;  Location: [facility];  Service: Urology;  Laterality: Right;  . EXTRACORPOREAL SHOCK WAVE LITHOTRIPSY Left 10/16/2017   Procedure: LEFT EXTRACORPOREAL SHOCK WAVE LITHOTRIPSY (ESWL);  Surgeon: Ihor Gully, MD;  Location: [facility];  Service: Urology;  Laterality: Left;  . HOLMIUM LASER APPLICATION Right 12/25/2020   Procedure: HOLMIUM LASER APPLICATION;  Surgeon: Crista Elliot, MD;  Location: [facility];  Service: Urology;  Laterality: Right;  . IR URETERAL STENT LEFT NEW ACCESS W/O SEP NEPHROSTOMY CATH  09/04/2020  . NEPHROLITHOTOMY Left 09/04/2020   Procedure: LEFT NEPHROLITHOTOMY PERCUTANEOUS POSSIBLE URETEROSCOPY WITH STENT PLACEMENT;  Surgeon: Crista Elliot, MD;  Location: [facility];  Service: Urology;  Laterality: Left;  REQUESTING 2 HRS  . TUBAL LIGATION Bilateral 2006  . WISDOM TOOTH EXTRACTION  2020   2 top wisdom teeth removed    SOCIAL HISTORY: Social History   Socioeconomic History  . Marital status: Married    Spouse name: Not on file  . Number of children: 3  . Years of education: Not on file  . Highest education level: Not on file  Occupational History  . Not on file  Tobacco Use  . Smoking status: Former    Current packs/day: 0.00    Average packs/day: 1  pack/day for 10.0 years (10.0 ttl pk-yrs)    Types: Cigarettes    Start date: 25    Quit date: 2005    Years since quitting: 19.6  . Smokeless tobacco: Never  Vaping Use  . Vaping status: Never Used  Substance and Sexual Activity  . Alcohol use: Yes    Alcohol/week: 1.0 standard drink of alcohol    Types: 1 Glasses of wine per week    Comment: 1 - 2 glasses of wine per week  . Drug use: No  . Sexual activity: Yes  Other Topics Concern  . Not on file  Social History Narrative  . Not on file   Social Determinants of Health   Financial Resource Strain: Not on file  Food Insecurity: Not on file  Transportation Needs: Not on file  Physical Activity: Not on file  Stress: Not on file  Social Connections: Not on file  Intimate Partner Violence: Not on file    FAMILY HISTORY Family History  Problem Relation Age of Onset  . Lung cancer Father   . Graves' disease Brother   . Schizophrenia Brother   . Depression Paternal Uncle   . Breast cancer Maternal Grandmother   .  Alzheimer's disease Maternal Grandmother   . Breast cancer Paternal Grandmother     ALLERGIES:  has No Known Allergies.  MEDICATIONS:  Current Outpatient Medications  Medication Sig Dispense Refill  . acetaminophen (TYLENOL) 500 MG tablet Take 1,000 mg by mouth every 6 (six) hours as needed for mild pain, fever or headache.    . alendronate (FOSAMAX) 70 MG tablet Take 70 mg by mouth once a week.    Marland Kitchen aspirin-acetaminophen-caffeine (EXCEDRIN MIGRAINE) 250-250-65 MG tablet Take 1 tablet by mouth daily as needed for migraine.    . Biotin 5000 MCG CAPS Take 1 capsule by mouth daily.    Marland Kitchen loratadine (CLARITIN) 10 MG tablet Take 10 mg by mouth daily.    . predniSONE (DELTASONE) 20 MG tablet Take 20 mg by mouth daily.    . SUMAtriptan (IMITREX) 50 MG tablet Take 1 tablet (50 mg total) by mouth every 2 (two) hours as needed for migraine. May repeat in 2 hours if headache persists or recurs. 10 tablet 3  .  triamcinolone cream (KENALOG) 0.1 % Apply 1 application topically daily as needed (eczema).     No current facility-administered medications for this visit.    PHYSICAL EXAMINATION:  ECOG PERFORMANCE STATUS: 1 - Symptomatic but completely ambulatory   Vitals:   08/09/22 1442  BP: 130/81  Pulse: 98  Resp: 16  Temp: 98.3 F (36.8 C)  SpO2: 99%    Filed Weights   08/09/22 1442  Weight: 196 lb 11.2 oz (89.2 kg)     Physical Exam Vitals and nursing note reviewed.  Constitutional:      Appearance: Normal appearance. She is not toxic-appearing or diaphoretic.  HENT:     Head: Normocephalic and atraumatic.     Right Ear: External ear normal.     Left Ear: External ear normal.     Nose: Nose normal. No congestion or rhinorrhea.  Eyes:     General: No scleral icterus.    Extraocular Movements: Extraocular movements intact.     Conjunctiva/sclera: Conjunctivae normal.     Pupils: Pupils are equal, round, and reactive to light.  Cardiovascular:     Rate and Rhythm: Normal rate.     Heart sounds: No murmur heard.    No friction rub. No gallop.  Pulmonary:     Effort: Pulmonary effort is normal. No respiratory distress.     Breath sounds: Normal breath sounds. No wheezing.  Abdominal:     General: Bowel sounds are normal.     Palpations: Abdomen is soft.     Tenderness: There is no abdominal tenderness. There is no guarding or rebound.  Musculoskeletal:        General: No swelling, tenderness or deformity.     Cervical back: Normal range of motion and neck supple. No rigidity or tenderness.  Lymphadenopathy:     Head:     Right side of head: No submental, submandibular, tonsillar, preauricular, posterior auricular or occipital adenopathy.     Left side of head: No submental, submandibular, tonsillar, preauricular, posterior auricular or occipital adenopathy.     Cervical: No cervical adenopathy.     Right cervical: No superficial, deep or posterior cervical adenopathy.     Left cervical: No superficial, deep or posterior cervical adenopathy.     Upper Body:     Right upper body: No supraclavicular, axillary, pectoral or epitrochlear adenopathy.     Left upper body: No supraclavicular, axillary, pectoral or epitrochlear adenopathy.  Skin:    General:  Skin is warm.     Coloration: Skin is not jaundiced.  Neurological:     General: No focal deficit present.     Mental Status: She is alert and oriented to person, place, and time.     Cranial Nerves: No cranial nerve deficit.  Psychiatric:        Mood and Affect: Mood normal.        Behavior: Behavior normal.        Thought Content: Thought content normal.        Judgment: Judgment normal.    LABORATORY DATA: I have personally reviewed the data as listed:  Appointment on 08/09/2022  Component Date Value Ref Range Status  . Preg, Serum 08/09/2022 NEGATIVE  NEGATIVE Final   Comment:        THE SENSITIVITY OF THIS METHODOLOGY IS >10 mIU/mL. Performed at [facility], [address] [place], [place] [address]   . LDH 08/09/2022 265 (H)  98 - 192 U/L Final   Performed at [facility], [address], [place], [place] [address]  . IgG (Immunoglobin G), Serum 08/09/2022 1,057  586 - 1,602 mg/dL Final  . IgA 95/28/4132 163  87 - 352 mg/dL Final  . IgM (Immunoglobulin M), Srm 08/09/2022 119  26 - 217 mg/dL Final  . Total Protein ELP 08/09/2022 6.6  6.0 - 8.5 g/dL Corrected  . Albumin SerPl Elph-Mcnc 08/09/2022 3.8  2.9 - 4.4 g/dL Corrected  . Alpha 1 08/09/2022 0.2  0.0 - 0.4 g/dL Corrected  . Alpha2 Glob SerPl Elph-Mcnc 08/09/2022 0.6  0.4 - 1.0 g/dL Corrected  . B-Globulin SerPl Elph-Mcnc 08/09/2022 0.9  0.7 - 1.3 g/dL Corrected  . Gamma Glob SerPl Elph-Mcnc 08/09/2022 1.1  0.4 - 1.8 g/dL Corrected  . M Protein SerPl Elph-Mcnc 08/09/2022 Not Observed  Not Observed g/dL Corrected  . Globulin, Total 08/09/2022 2.8  2.2 - 3.9 g/dL Corrected  . Albumin/Glob SerPl  08/09/2022 1.4  0.7 - 1.7 Corrected  . IFE 1 08/09/2022 Comment   Corrected   Comment: (NOTE) The immunofixation pattern appears unremarkable. Evidence of monoclonal protein is not apparent.   . Please Note 08/09/2022 Comment   Corrected   Comment: (NOTE) Protein electrophoresis scan will follow via computer, mail, or courier delivery. Performed At: [facility] [address] [place], [place] [id number] Jolene Schimke MD [id number]   . WBC Count 08/09/2022 11.4 (H)  4.0 - 10.5 K/uL Final  . RBC 08/09/2022 4.13  3.87 - 5.11 MIL/uL Final  . Hemoglobin 08/09/2022 13.1  12.0 - 15.0 g/dL Final  . HCT 95/63/8756 39.1  36.0 - 46.0 % Final  . MCV 08/09/2022 94.7  80.0 - 100.0 fL Final  . MCH 08/09/2022 31.7  26.0 - 34.0 pg Final  . MCHC 08/09/2022 33.5  30.0 - 36.0 g/dL Final  . RDW 43/32/9518 13.2  11.5 - 15.5 % Final  . Platelet Count 08/09/2022 335  150 - 400 K/uL Final  . nRBC 08/09/2022 0.0  0.0 - 0.2 % Final  . Neutrophils Relative % 08/09/2022 85  % Final  . Neutro Abs 08/09/2022 9.6 (H)  1.7 - 7.7 K/uL Final  . Lymphocytes Relative 08/09/2022 12  % Final  . Lymphs Abs 08/09/2022 1.4  0.7 - 4.0 K/uL Final  . Monocytes Relative 08/09/2022 3  % Final  . Monocytes Absolute 08/09/2022 0.3  0.1 - 1.0 K/uL Final  . Eosinophils Relative 08/09/2022 0  % Final  . Eosinophils  Absolute 08/09/2022 0.0  0.0 - 0.5 K/uL Final  . Basophils Relative 08/09/2022 0  % Final  . Basophils Absolute 08/09/2022 0.0  0.0 - 0.1 K/uL Final  . Immature Granulocytes 08/09/2022 0  % Final  . Abs Immature Granulocytes 08/09/2022 0.05  0.00 - 0.07 K/uL Final   Performed at [facility], [address], [place], [place] [address]  . Sodium 08/09/2022 139  135 - 145 mmol/L Final  . Potassium 08/09/2022 3.8  3.5 - 5.1 mmol/L Final  . Chloride 08/09/2022 103  98 - 111 mmol/L Final  . CO2 08/09/2022 27  22 - 32 mmol/L Final  . Glucose, Bld 08/09/2022 126 (H)  70 - 99 mg/dL  Final   Glucose reference range applies only to samples taken after fasting for at least 8 hours.  . BUN 08/09/2022 25 (H)  6 - 20 mg/dL Final  . Creatinine 65/78/4696 1.23 (H)  0.44 - 1.00 mg/dL Final  . Calcium 29/52/8413 10.5 (H)  8.9 - 10.3 mg/dL Final  . Total Protein 08/09/2022 7.3  6.5 - 8.1 g/dL Final  . Albumin 24/40/1027 4.2  3.5 - 5.0 g/dL Final  . AST 25/36/6440 19  15 - 41 U/L Final  . ALT 08/09/2022 14  0 - 44 U/L Final  . Alkaline Phosphatase 08/09/2022 48  38 - 126 U/L Final  . Total Bilirubin 08/09/2022 0.4  0.3 - 1.2 mg/dL Final  . GFR, Estimated 08/09/2022 56 (L)  >60 mL/min Final   Comment: (NOTE) Calculated using the CKD-EPI Creatinine Equation (2021)   . Anion gap 08/09/2022 9  5 - 15 Final   Performed at [facility], [address], [place], [place] [address]  Office Visit on 08/09/2022  Component Date Value Ref Range Status  . Calcium, Ionized, Serum 08/09/2022 5.4  4.5 - 5.6 mg/dL Final   Comment: (NOTE) Performed At: [facility] [address] [place], [place] [id number] Jolene Schimke MD [id number]   . Kappa free light chain 08/09/2022 19.8 (H)  3.3 - 19.4 mg/L Final  . Lambda free light chains 08/09/2022 25.9  5.7 - 26.3 mg/L Final  . Kappa, lambda light chain ratio 08/09/2022 0.76  0.26 - 1.65 Final   Comment: (NOTE) Performed At: [facility] [address] [place], [place] [id number] Jolene Schimke MD [id number]   . Vitamin A (Retinoic Acid) 08/09/2022 58.0  20.1 - 62.0 ug/dL Final   Comment: (NOTE) Reference intervals for vitamin A determined from [facility] internal studies. Individuals with vitamin A less than 20 ug/dL are considered vitamin A deficient and those with serum concentrations less than 10 ug/dL are considered severely deficient. This test was developed and its performance characteristics determined by [facility]. It has not been cleared or approved by the Food and Drug  Administration. Performed At: [facility] [address] [place], [place] [id number] Jolene Schimke MD [id number]   . Vit D, 25-Hydroxy 08/09/2022 52.20  30 - 100 ng/mL Final   Comment: (NOTE) Vitamin D deficiency has been defined by the Institute of Medicine  and an Endocrine Society practice guideline as a level of serum 25-OH  vitamin D less than 20 ng/mL (1,2). The Endocrine Society went on to  further define vitamin D insufficiency as a level between 21 and 29  ng/mL (2).  1. IOM (Institute of Medicine). 2010. Dietary reference intakes for  calcium and D. [place]: [employer]. 2. Holick MF, Binkley [place], Bischoff-Ferrari HA,  et al. Evaluation,  treatment, and prevention of vitamin D deficiency: an Endocrine  Society clinical practice guideline, JCEM. 2011 Jul; 96(7): 1911-30.  Performed at [facility], [address] [place], [place] [address]   . Vit D, 1,25-Dihydroxy 08/09/2022 74.0  24.8 - 81.5 pg/mL Final   Comment: (NOTE) Performed At: [facility] [address] [place], [place] [id number] Jolene Schimke MD [id number]   . PTH 08/09/2022 NPLASM  pg/mL Final   Comment: (NOTE) Test not performed. No plasma specimen received.      Pam B. was notified 08/12/2022 Performed At: [facility] [address] [place], [place] [id number] Jolene Schimke MD [id number]     RADIOGRAPHIC STUDIES: I have personally reviewed the radiological images as listed and agree with the findings in the report  No results found.  ASSESSMENT/PLAN  42 y.o. female is here because of  hypercalcemia.  Medical history notable for anemia, nephrolithiasis, hypertension, pulmonary embolism in July 2020 while on oral contraceptives, syncope.  Hypercalcemia-  Possible causes  Parathyroid mediated  Primary hyperparathyroidism (sporadic)  Inherited variants Multiple endocrine neoplasia (MEN) syndromes, Familial isolated  hyperparathyroidism, Hyperparathyroidism-jaw tumor syndrome  Familial hypocalciuric hypercalcemia  Tertiary hyperparathyroidism (renal failure)   Non-parathyroid mediated  Hypercalcemia of malignancy PTHrP, Increased calcitriol (activation of extrarenal 1-alpha-hydroxylase), Osteolytic bone metastases and local cytokines  Vitamin D intoxication  Chronic granulomatous disorders Increased calcitriol (activation of extrarenal 1-alpha-hydroxylase)   Medications Thiazide diuretics Lithium Teriparatide Abaloparatide Excessive vitamin A Theophylline toxicity   Miscellaneous Hyperthyroidism Acromegaly Pheochromocytoma Adrenal insufficiency Immobilization Parenteral nutrition Milk-alkali syndrome   Evaluation to date   May 20, 2022: SPEP showed no evidence of paraprotein.  1,25 dihydroxy vitamin D 113 (high) PTH RP negative.  PTH 9 (low) May 31, 2022: ACE level 71 (normal) June 04, 2022: HIV negative.  RPR negative June 05, 2022: CT chest no evidence of adenopathy or pulmonary disease   Jun 13, 2022: CRP less than 1 Jun 26, 2022 CT abdomen pelvis-notable for bilateral nephrolithiasis   July 25, 2022: QuantiFERON-TB gold negative  August 09 2022- Not taking calcium, Vitamin D or supplements.  Placed on prednisone and alendronate by Endicrinologist.     Laboratory Evaluation:  Ionized calcium, 1,25-hydroxyvitamin D, 25-hydroxyvitamin D, SPEP with IEP and free light chains, Vitamin A,   Consider TSH/FT4, Serum phosphate, 24 hr urine for calcium.  Refer for bone marrow bx and aspirate to evaluate for lymphoma and possible sarcoidosis     Cancer Staging  No matching staging information was found for the patient.    No problem-specific Assessment & Plan notes found for this encounter.    Orders Placed This Encounter  Procedures  . CT BONE MARROW BIOPSY & ASPIRATION    Please evaluate for lymphoma (will need flow) and granulomatous disease (AFB stain and CTX)    Standing Status:   Future     Number of Occurrences:   1    Standing Expiration Date:   08/09/2023    Order Specific Question:   Reason for Exam (SYMPTOM  OR DIAGNOSIS REQUIRED)    Answer:   Evaluation of hypercalcemia    Order Specific Question:   Is patient pregnant?    Answer:   No    Order Specific Question:   Preferred location?    Answer:   [facility]  . NM PET Image Initial (PI) Skull Base To Thigh    Standing Status:   Future    Number of Occurrences:   1  Standing Expiration Date:   08/09/2023    Order Specific Question:   If indicated for the ordered procedure, I authorize the administration of a radiopharmaceutical per Radiology protocol    Answer:   Yes    Order Specific Question:   Is the patient pregnant?    Answer:   No    Order Specific Question:   Preferred imaging location?    Answer:   [facility]  . CMP (Cancer Center only)    Standing Status:   Future    Number of Occurrences:   1    Standing Expiration Date:   08/09/2023  . CBC with Differential (Cancer Center Only)    Standing Status:   Future    Number of Occurrences:   1    Standing Expiration Date:   08/09/2023  . Calcium, ionized  . Kappa/lambda light chains  . Vitamin A  . Multiple Myeloma Panel (SPEP&IFE w/QIG)    Standing Status:   Future    Number of Occurrences:   1    Standing Expiration Date:   08/09/2023  . VITAMIN D 25 Hydroxy (Vit-D Deficiency, Fractures)  . Calcitriol (1,25 di-OH Vit D)  . Lactate dehydrogenase    Standing Status:   Future    Number of Occurrences:   1    Standing Expiration Date:   08/09/2023  . Parathyroid hormone, intact (no Ca)  . HCG, serum qualitative    Standing Status:   Future    Number of Occurrences:   1    Standing Expiration Date:   08/09/2023    45  minutes was spent in patient care.  This included time spent preparing to see the patient (e.g., review of tests), obtaining and/or reviewing separately obtained history, counseling and educating the patient/family/caregiver,  ordering medications, tests, or procedures; documenting clinical information in the electronic or other health record, independently interpreting results and communicating results to the patient/family/caregiver as well as coordination of care.       All questions were answered. The patient knows to call the clinic with any problems, questions or concerns.  This note was electronically signed.    Loni Muse, MD  09/17/2022 3:55 PM

## 2022-08-09 ENCOUNTER — Inpatient Hospital Stay: Payer: No Typology Code available for payment source

## 2022-08-09 ENCOUNTER — Other Ambulatory Visit: Payer: Self-pay

## 2022-08-09 ENCOUNTER — Inpatient Hospital Stay: Payer: No Typology Code available for payment source | Attending: Oncology | Admitting: Oncology

## 2022-08-09 DIAGNOSIS — N200 Calculus of kidney: Secondary | ICD-10-CM | POA: Diagnosis not present

## 2022-08-09 LAB — CBC WITH DIFFERENTIAL (CANCER CENTER ONLY)
Abs Immature Granulocytes: 0.05 10*3/uL (ref 0.00–0.07)
Basophils Absolute: 0 10*3/uL (ref 0.0–0.1)
Basophils Relative: 0 %
Eosinophils Absolute: 0 10*3/uL (ref 0.0–0.5)
Eosinophils Relative: 0 %
HCT: 39.1 % (ref 36.0–46.0)
Hemoglobin: 13.1 g/dL (ref 12.0–15.0)
Immature Granulocytes: 0 %
Lymphocytes Relative: 12 %
Lymphs Abs: 1.4 10*3/uL (ref 0.7–4.0)
MCH: 31.7 pg (ref 26.0–34.0)
MCHC: 33.5 g/dL (ref 30.0–36.0)
MCV: 94.7 fL (ref 80.0–100.0)
Monocytes Absolute: 0.3 10*3/uL (ref 0.1–1.0)
Monocytes Relative: 3 %
Neutro Abs: 9.6 10*3/uL — ABNORMAL HIGH (ref 1.7–7.7)
Neutrophils Relative %: 85 %
Platelet Count: 335 10*3/uL (ref 150–400)
RBC: 4.13 MIL/uL (ref 3.87–5.11)
RDW: 13.2 % (ref 11.5–15.5)
WBC Count: 11.4 10*3/uL — ABNORMAL HIGH (ref 4.0–10.5)
nRBC: 0 % (ref 0.0–0.2)

## 2022-08-09 LAB — CMP (CANCER CENTER ONLY)
ALT: 14 U/L (ref 0–44)
AST: 19 U/L (ref 15–41)
Albumin: 4.2 g/dL (ref 3.5–5.0)
Alkaline Phosphatase: 48 U/L (ref 38–126)
Anion gap: 9 (ref 5–15)
BUN: 25 mg/dL — ABNORMAL HIGH (ref 6–20)
CO2: 27 mmol/L (ref 22–32)
Calcium: 10.5 mg/dL — ABNORMAL HIGH (ref 8.9–10.3)
Chloride: 103 mmol/L (ref 98–111)
Creatinine: 1.23 mg/dL — ABNORMAL HIGH (ref 0.44–1.00)
GFR, Estimated: 56 mL/min — ABNORMAL LOW (ref >60)
Glucose, Bld: 126 mg/dL — ABNORMAL HIGH (ref 70–99)
Potassium: 3.8 mmol/L (ref 3.5–5.1)
Sodium: 139 mmol/L (ref 135–145)
Total Bilirubin: 0.4 mg/dL (ref 0.3–1.2)
Total Protein: 7.3 g/dL (ref 6.5–8.1)

## 2022-08-09 LAB — LACTATE DEHYDROGENASE: LDH: 265 U/L — ABNORMAL HIGH (ref 98–192)

## 2022-08-09 LAB — VITAMIN D 25 HYDROXY (VIT D DEFICIENCY, FRACTURES): Vit D, 25-Hydroxy: 52.2 ng/mL (ref 30–100)

## 2022-08-09 LAB — HCG, SERUM, QUALITATIVE: Preg, Serum: NEGATIVE

## 2022-08-11 LAB — CALCIUM, IONIZED: Calcium, Ionized, Serum: 5.4 mg/dL (ref 4.5–5.6)

## 2022-08-12 ENCOUNTER — Other Ambulatory Visit: Payer: Self-pay

## 2022-08-12 ENCOUNTER — Telehealth: Payer: Self-pay | Admitting: Oncology

## 2022-08-12 LAB — KAPPA/LAMBDA LIGHT CHAINS
Kappa free light chain: 19.8 mg/L — ABNORMAL HIGH (ref 3.3–19.4)
Kappa, lambda light chain ratio: 0.76 (ref 0.26–1.65)
Lambda free light chains: 25.9 mg/L (ref 5.7–26.3)

## 2022-08-12 LAB — CALCITRIOL (1,25 DI-OH VIT D): Vit D, 1,25-Dihydroxy: 74 pg/mL (ref 24.8–81.5)

## 2022-08-12 LAB — VITAMIN A: Vitamin A (Retinoic Acid): 58 ug/dL (ref 20.1–62.0)

## 2022-08-12 LAB — PARATHYROID HORMONE, INTACT (NO CA)

## 2022-08-13 ENCOUNTER — Telehealth: Payer: Self-pay | Admitting: Oncology

## 2022-08-13 NOTE — Telephone Encounter (Signed)
Left patient a vm regarding upcoming appointment  

## 2022-08-14 LAB — MULTIPLE MYELOMA PANEL, SERUM
Albumin SerPl Elph-Mcnc: 3.8 g/dL (ref 2.9–4.4)
Albumin/Glob SerPl: 1.4 (ref 0.7–1.7)
Alpha 1: 0.2 g/dL (ref 0.0–0.4)
Alpha2 Glob SerPl Elph-Mcnc: 0.6 g/dL (ref 0.4–1.0)
B-Globulin SerPl Elph-Mcnc: 0.9 g/dL (ref 0.7–1.3)
Gamma Glob SerPl Elph-Mcnc: 1.1 g/dL (ref 0.4–1.8)
Globulin, Total: 2.8 g/dL (ref 2.2–3.9)
IgA: 163 mg/dL (ref 87–352)
IgG (Immunoglobin G), Serum: 1057 mg/dL (ref 586–1602)
IgM (Immunoglobulin M), Srm: 119 mg/dL (ref 26–217)
Total Protein ELP: 6.6 g/dL (ref 6.0–8.5)

## 2022-08-22 ENCOUNTER — Telehealth: Payer: Self-pay | Admitting: Oncology

## 2022-09-02 ENCOUNTER — Encounter (HOSPITAL_COMMUNITY)
Admission: RE | Admit: 2022-09-02 | Discharge: 2022-09-02 | Disposition: A | Discharge status: Home / Self Care | Payer: PRIVATE HEALTH INSURANCE | Source: Ambulatory Visit | Attending: Oncology | Admitting: Oncology

## 2022-09-02 LAB — GLUCOSE, CAPILLARY: Glucose-Capillary: 89 mg/dL (ref 70–99)

## 2022-09-02 MED ORDER — FLUDEOXYGLUCOSE F - 18 (FDG) INJECTION
10.0000 | Freq: Once | INTRAVENOUS | Status: AC | PRN
  Administered 2022-09-02: 9.5 via INTRAVENOUS

## 2022-09-09 ENCOUNTER — Other Ambulatory Visit: Payer: Self-pay | Admitting: Radiology

## 2022-09-10 ENCOUNTER — Encounter (HOSPITAL_COMMUNITY): Payer: Self-pay

## 2022-09-10 ENCOUNTER — Ambulatory Visit (HOSPITAL_COMMUNITY)
Admission: RE | Admit: 2022-09-10 | Discharge: 2022-09-10 | Disposition: A | Discharge status: Home / Self Care | Payer: PRIVATE HEALTH INSURANCE | Source: Ambulatory Visit | Attending: Oncology | Admitting: Oncology

## 2022-09-10 LAB — CBC WITH DIFFERENTIAL/PLATELET
Abs Immature Granulocytes: 0.05 10*3/uL (ref 0.00–0.07)
Basophils Absolute: 0.1 10*3/uL (ref 0.0–0.1)
Basophils Relative: 1 %
Eosinophils Absolute: 0.2 10*3/uL (ref 0.0–0.5)
Eosinophils Relative: 2 %
HCT: 40.6 % (ref 36.0–46.0)
Hemoglobin: 13.1 g/dL (ref 12.0–15.0)
Immature Granulocytes: 1 %
Lymphocytes Relative: 34 %
Lymphs Abs: 3.2 10*3/uL (ref 0.7–4.0)
MCH: 30.4 pg (ref 26.0–34.0)
MCHC: 32.3 g/dL (ref 30.0–36.0)
MCV: 94.2 fL (ref 80.0–100.0)
Monocytes Absolute: 0.7 10*3/uL (ref 0.1–1.0)
Monocytes Relative: 8 %
Neutro Abs: 5.4 10*3/uL (ref 1.7–7.7)
Neutrophils Relative %: 54 %
Platelets: 257 10*3/uL (ref 150–400)
RBC: 4.31 MIL/uL (ref 3.87–5.11)
RDW: 12.4 % (ref 11.5–15.5)
WBC: 9.6 10*3/uL (ref 4.0–10.5)
nRBC: 0 % (ref 0.0–0.2)

## 2022-09-10 MED ORDER — FENTANYL CITRATE (PF) 100 MCG/2ML IJ SOLN
INTRAMUSCULAR | Status: AC
  Filled 2022-09-10: qty 4

## 2022-09-10 MED ORDER — DIPHENHYDRAMINE HCL 50 MG/ML IJ SOLN
INTRAMUSCULAR | Status: AC
  Filled 2022-09-10: qty 1

## 2022-09-10 MED ORDER — NALOXONE HCL 0.4 MG/ML IJ SOLN
INTRAMUSCULAR | Status: AC
  Filled 2022-09-10: qty 1

## 2022-09-10 MED ORDER — FENTANYL CITRATE (PF) 100 MCG/2ML IJ SOLN
INTRAMUSCULAR | Status: AC | PRN
  Administered 2022-09-10 (×3): 50 ug via INTRAVENOUS

## 2022-09-10 MED ORDER — DIPHENHYDRAMINE HCL 50 MG/ML IJ SOLN
INTRAMUSCULAR | Status: AC | PRN
  Administered 2022-09-10: 50 mg via INTRAVENOUS

## 2022-09-10 MED ORDER — MIDAZOLAM HCL 2 MG/2ML IJ SOLN
INTRAMUSCULAR | Status: AC
  Filled 2022-09-10: qty 4

## 2022-09-10 MED ORDER — MIDAZOLAM HCL 2 MG/2ML IJ SOLN
INTRAMUSCULAR | Status: AC | PRN
  Administered 2022-09-10 (×3): 1 mg via INTRAVENOUS

## 2022-09-10 MED ORDER — SODIUM CHLORIDE 0.9 % IV SOLN: INTRAVENOUS | Status: DC

## 2022-09-10 MED ORDER — LIDOCAINE HCL (PF) 1 % IJ SOLN
INTRAMUSCULAR | Status: AC | PRN
  Administered 2022-09-10: 10 mL

## 2022-09-10 MED ORDER — FLUMAZENIL 0.5 MG/5ML IV SOLN
INTRAVENOUS | Status: AC
  Filled 2022-09-10: qty 5

## 2022-09-10 NOTE — Procedures (Signed)
Interventional Radiology Procedure:   Indications: Hypercalcemia  Procedure: CT guided bone marrow biopsy  Findings: 2 aspirates and 2 cores from right ilium  Complications: None     EBL: Minimal, less than 10 ml  Plan: Discharge to home in one hour.   Kathryn Crawford R. Lowella Dandy, MD  Pager: [phone]

## 2022-09-10 NOTE — Sedation Documentation (Signed)
Sample #3 obtained 

## 2022-09-10 NOTE — Sedation Documentation (Signed)
Sample #4 obtained 

## 2022-09-10 NOTE — Sedation Documentation (Signed)
Sample #2 obtained 

## 2022-09-10 NOTE — H&P (Signed)
Chief Complaint: Patient was seen in consultation today for hypercalcemia  Referring Physician(s): Ribakove,Everett C  Supervising Physician: Richarda Overlie  Patient Status: [facility] - Out-pt  History of Present Illness: Kathryn Crawford is a 42 y.o. female with PMH significant for anemia, anxiety, hypertension, pulmonary embolism, and syncope being seen today in relation to hypercalcemia. Patient is followed by Dr Angelene Giovanni who has requested patient receive image-guided bone marrow biopsy to evaluate patient's hypercalcemia, and to evaluate for lymphoma and granulomatous disease.   Past Medical History:  Diagnosis Date   Anemia    Anxiety    COVID-19 08/2019   cough, headache, fever, all symptoms resolved per pt on 12/20/20   Headache    History of kidney stones    Hypertension    Pulmonary embolism (HCC) 08/2018   bilateral, pt states PEs were due to [facility], pt stated that she was on blood thinners for 6 months and embolisms resolved per 12/20/20 call with pt   Stress incontinence    Syncope and collapse     Past Surgical History:  Procedure Laterality Date   AUGMENTATION MAMMAPLASTY Bilateral 2008   BREAST ENHANCEMENT SURGERY  2008   CYSTOSCOPY WITH RETROGRADE PYELOGRAM, URETEROSCOPY AND STENT PLACEMENT Left 07/06/2020   Procedure: CYSTOSCOPY WITH RETROGRADE PYELOGRAM, URETEROSCOPY AND STENT PLACEMENT;  Surgeon: Crista Elliot, MD;  Location: [facility];  Service: Urology;  Laterality: Left;   CYSTOSCOPY WITH STENT PLACEMENT Left 09/19/2017   Procedure: CYSTOSCOPY WITH STENT PLACEMENT;  Surgeon: Ihor Gully, MD;  Location: [facility];  Service: Urology;  Laterality: Left;   CYSTOSCOPY/URETEROSCOPY/HOLMIUM LASER/STENT PLACEMENT Right 12/25/2020   Procedure: CYSTOSCOPY RIGHT URETEROSCOPY/, RETROGRADE, HOLMIUM LASER/STENT PLACEMENT;  Surgeon: Crista Elliot, MD;  Location: [facility];  Service: Urology;  Laterality: Right;   EXTRACORPOREAL SHOCK WAVE LITHOTRIPSY  Left 10/16/2017   Procedure: LEFT EXTRACORPOREAL SHOCK WAVE LITHOTRIPSY (ESWL);  Surgeon: Ihor Gully, MD;  Location: [facility];  Service: Urology;  Laterality: Left;   HOLMIUM LASER APPLICATION Right 12/25/2020   Procedure: HOLMIUM LASER APPLICATION;  Surgeon: Crista Elliot, MD;  Location: [facility];  Service: Urology;  Laterality: Right;   IR URETERAL STENT LEFT NEW ACCESS W/O SEP NEPHROSTOMY CATH  09/04/2020   NEPHROLITHOTOMY Left 09/04/2020   Procedure: LEFT NEPHROLITHOTOMY PERCUTANEOUS POSSIBLE URETEROSCOPY WITH STENT PLACEMENT;  Surgeon: Crista Elliot, MD;  Location: [facility];  Service: Urology;  Laterality: Left;  REQUESTING 2 HRS   TUBAL LIGATION Bilateral 2006   WISDOM TOOTH EXTRACTION  2020   2 top wisdom teeth removed    Allergies: Patient has no known allergies.  Medications: Prior to Admission medications   Medication Sig Start Date End Date Taking? Authorizing Provider  aspirin-acetaminophen-caffeine (EXCEDRIN MIGRAINE) [phone] MG tablet Take 1 tablet by mouth daily as needed for migraine.   Yes [provider]  Biotin 5000 MCG CAPS Take 1 capsule by mouth daily.   Yes [provider]  loratadine (CLARITIN) 10 MG tablet Take 10 mg by mouth daily.   Yes [provider]  predniSONE (DELTASONE) 20 MG tablet Take 20 mg by mouth daily.   Yes [provider]  acetaminophen (TYLENOL) 500 MG tablet Take 1,000 mg by mouth every 6 (six) hours as needed for mild pain, fever or headache.    [provider]  alendronate (FOSAMAX) 70 MG tablet Take 70 mg by mouth once a week.    [provider]  SUMAtriptan (IMITREX) 50 MG tablet  Take 1 tablet (50 mg total) by mouth every 2 (two) hours as needed for migraine. May repeat in 2 hours if headache persists or recurs. 05/13/22   Windell Norfolk, MD  triamcinolone cream (KENALOG) 0.1 % Apply 1 application topically daily as needed (eczema).    [provider]      Family History  Problem Relation Age of Onset   Lung cancer Father    Graves' disease Brother    Schizophrenia Brother    Depression Paternal Uncle    Breast cancer Maternal Grandmother    Alzheimer's disease Maternal Grandmother    Breast cancer Paternal Grandmother     Social History   Socioeconomic History   Marital status: Married    Spouse name: Not on file   Number of children: 3   Years of education: Not on file   Highest education level: Not on file  Occupational History   Not on file  Tobacco Use   Smoking status: Former    Current packs/day: 0.00    Average packs/day: 1 pack/day for 10.0 years (10.0 ttl pk-yrs)    Types: Cigarettes    Start date: 29    Quit date: 2005    Years since quitting: 19.5   Smokeless tobacco: Never  Vaping Use   Vaping status: Never Used  Substance and Sexual Activity   Alcohol use: Yes    Alcohol/week: 1.0 standard drink of alcohol    Types: 1 Glasses of wine per week    Comment: 1 - 2 glasses of wine per week   Drug use: No   Sexual activity: Yes  Other Topics Concern   Not on file  Social History Narrative   Not on file   Social Determinants of Health   Financial Resource Strain: Not on file  Food Insecurity: Not on file  Transportation Needs: Not on file  Physical Activity: Not on file  Stress: Not on file  Social Connections: Not on file    Code Status: Full code  Review of Systems: A 12 point ROS discussed and pertinent positives are indicated in the HPI above.  All other systems are negative.  Review of Systems  Constitutional:  Negative for chills and fever.  Respiratory:  Negative for chest tightness and shortness of breath.   Cardiovascular:  Negative for chest pain and leg swelling.  Gastrointestinal:  Negative for abdominal pain, diarrhea, nausea and vomiting.  Neurological:  Negative for dizziness and headaches.  Psychiatric/Behavioral:  Negative for confusion.     Vital Signs: BP 118/77    Pulse 82   Temp 98.3 F (36.8 C) (Oral)   Resp 16   Ht 5\' 9"  (1.753 m)   Wt 194 lb (88 kg)   LMP 08/20/2022   SpO2 97%   BMI 28.65 kg/m     Physical Exam Vitals reviewed.  Constitutional:      General: She is not in acute distress.    Appearance: She is not ill-appearing.  Cardiovascular:     Rate and Rhythm: Normal rate and regular rhythm.     Pulses: Normal pulses.     Heart sounds: Normal heart sounds.  Pulmonary:     Effort: Pulmonary effort is normal.     Breath sounds: Normal breath sounds.  Abdominal:     Palpations: Abdomen is soft.     Tenderness: There is no abdominal tenderness.  Musculoskeletal:     Right lower leg: No edema.     Left lower leg: No edema.  Skin:    General: Skin is warm and dry.  Neurological:     Mental Status: She is alert and oriented to person, place, and time.  Psychiatric:        Mood and Affect: Mood normal.        Behavior: Behavior normal.        Thought Content: Thought content normal.        Judgment: Judgment normal.     Imaging: NM PET Image Initial (PI) Skull Base To Thigh  Result Date: 09/09/2022 CLINICAL DATA:  Initial treatment strategy for occult malignancy. Hypercalcemia. EXAM: NUCLEAR MEDICINE PET SKULL BASE TO THIGH TECHNIQUE: 9.5 mCi F-18 FDG was injected intravenously. Full-ring PET imaging was performed from the skull base to thigh after the radiotracer. CT data was obtained and used for attenuation correction and anatomic localization. Fasting blood glucose: 89 mg/dl COMPARISON:  1/61/09. FINDINGS: Mediastinal blood pool activity: SUV max 2.42 Liver activity: SUV max NA NECK: No hypermetabolic lymph nodes in the neck. Incidental CT findings: None. CHEST: No hypermetabolic mediastinal or hilar nodes. No suspicious pulmonary nodules on the CT scan. Incidental CT findings: None. ABDOMEN/PELVIS: No abnormal hypermetabolic activity within the liver, pancreas, adrenal glands, or spleen. No hypermetabolic lymph nodes in the  abdomen or pelvis. Increased uptake in the right ovary has an SUV max 7.94, image 174 of the fused PET-CT images. Incidental CT findings: Left kidney stones are identified. The largest stone is in the inferior pole measuring 7 mm. There is a signs of previous percutaneous intervention to the inferior pole of the left kidney with soft tissues tract extending through the left retroperitoneum and left posterior flank subcutaneous soft tissues. Multiple small calcifications identified along the percutaneous tract as noted previously, image 132/4. SKELETON: There is diffuse increased soft tissue infiltration and skin thickening overlying bilateral gluteal musculature with corresponding increased radiotracer uptake, SUV max on the left is equal to 4.68. On the right the SUV max is equal to 5.06. Scattered tiny areas of calcification noted within this area. This finding was present on the exam from 07/06/2020. No tracer avid bone lesions identified. Incidental CT findings: Status post bilateral implant augmentation of the breast. IMPRESSION: 1. No signs of tracer avid malignancy. 2. Increased radiotracer uptake in the right ovary is likely physiologic. 3. Increased soft tissue infiltration and skin thickening with scattered punctate calcifications overlying bilateral gluteal musculature with corresponding increased radiotracer uptake. This finding was present on the exam from 07/06/2020. Etiology is indeterminate and may be the sequelae of remote trauma or inflammation. 4. Left kidney stones. Electronically Signed   By: Signa Kell M.D.   On: 09/09/2022 11:19    Labs:  CBC: Recent Labs    05/07/22 2330 08/09/22 1535  WBC 10.6* 11.4*  HGB 12.9 13.1  HCT 37.7 39.1  PLT 322 335    COAGS: No results for input(s): "INR", "APTT" in the last 8760 hours.  BMP: Recent Labs    05/07/22 2330 08/09/22 1535  NA 134* 139  K 3.3* 3.8  CL 98 103  CO2 24 27  GLUCOSE 127* 126*  BUN 33* 25*  CALCIUM 13.9*  10.5*  CREATININE 1.67* 1.23*  GFRNONAA 39* 56*    LIVER FUNCTION TESTS: Recent Labs    08/09/22 1535  BILITOT 0.4  AST 19  ALT 14  ALKPHOS 48  PROT 7.3  ALBUMIN 4.2    TUMOR MARKERS: No results for input(s): "AFPTM", "CEA", "CA199", "CHROMGRNA" in the last 8760 hours.  Assessment  and Plan:  Kathryn Crawford is a 42 yo female being seen today in relation to hypercalcemia. Patient is followed by Dr Angelene Giovanni who has referred patient to IR for image-guided bone marrow biopsy to further evaluate patient's hypercalcemia and to evaluate patient for lymphoma or granulomatous disease. Patient presents today in her usual state of health and is NPO. Case has been reviewed with Dr Lowella Dandy and is scheduled to proceed with image-guided bone marrow biopsy on 09/10/22.  Risks and benefits of image-guided bone marrow biopsy was discussed with the patient and/or patient's family including, but not limited to bleeding, infection, damage to adjacent structures or low yield requiring additional tests.  All of the questions were answered and there is agreement to proceed.  Consent signed and in chart.   Thank you for this interesting consult.  I greatly enjoyed meeting Meygan Mcdivitt and look forward to participating in their care.  A copy of this report was sent to the requesting provider on this date.  Electronically Signed: Kennieth Francois, PA-C 09/10/2022, 9:37 AM   I spent a total of  15 Minutes   in face to face in clinical consultation, greater than 50% of which was counseling/coordinating care for hypercalcemia.

## 2022-09-10 NOTE — Sedation Documentation (Signed)
Sample #1 obtained 

## 2022-09-13 ENCOUNTER — Telehealth: Payer: No Typology Code available for payment source | Admitting: Oncology

## 2022-09-13 LAB — SURGICAL PATHOLOGY

## 2022-09-26 NOTE — Progress Notes (Signed)
[facility] Cancer Initial Visit:  Patient Care Team: Ollen Bowl, MD as PCP - General (Internal Medicine)  CHIEF COMPLAINTS/PURPOSE OF CONSULTATION:  HISTORY OF PRESENTING ILLNESS: Kathryn Crawford 42 y.o. female is here because of  hypercalcemia  Medical history notable for anemia, nephrolithiasis, hypertension, pulmonary embolism in July 2020 while on oral contraceptives, syncope.  May 07, 2022: WBC 10.0 hemoglobin 12.9 platelet count 322; 51 segs 36 lymphs 9 monos 3 eos.  Chemistry is notable for calcium 13.9 creatinine 1.67 CT head without contrast to evaluate headache.  No acute abnormality May 08, 2022: MRI angio/MRI of brain normal  May 20, 2022: SPEP showed no evidence of paraprotein.  1,25 dihydroxy vitamin D 113 (high) PTH RP negative.  PTH 9 (low) May 31, 2022: ACE level 71 (normal) June 04, 2022: HIV negative.  RPR negative June 05, 2022: CT chest no evidence of adenopathy or pulmonary disease  Jun 13, 2022: CRP less than 1 Jun 26, 2022 CT abdomen pelvis-notable for bilateral nephrolithiasis  July 25, 2022: QuantiFERON-TB gold negative  August 09 2022:  [facility] In 2020 patient was initially diagnosed with nephrolithiasis.  In 2021 was found to have a bilateral staghorn calculi which had to be surgically removed.  In 2020 she she was diagnosed with pulmonary emboli which occurred 18 days after being placed on OCP's.  Has seen an Endocrinologist regarding the hypercalcemia and was placed prednisone and alendronate which has improved the hypercalcemia.   No family history of neprolithiasis Not taking Calcium, Vitamin D or any supplements  Social:  Horticulturist, commercial.  Tobacco none.  EtOH socially  [facility] Mother alive 12 does not see doctors Father died 22 lung cancer Brother alive 61 Grave's disease and mental illness.    WBC 11.4 hemoglobin 13.1 platelet count 335; 85 segs 12 lymphs 3 monos.  ANC 9.6 SPEP with  IEP showed no paraprotein serum free kappa 19.8 lambda 25.9 with a ratio of 0.76 PTH intact not performed IgG 1057 IgA 163 IgM 119 1, 25-hydroxy vitamin D 74.  25-hydroxy vitamin D 52.2.  Vitamin A 58 LDH 255  Pregnancy test negative. CMP notable for glucose 126 BUN 25 creatinine 1.23 calcium 10.5  September 10, 2022: Bone marrow biopsy and aspirate--normocellular bone marrow with trilineage hematopoiesis.   No granulomas seen.  No evidence of lymphoproliferative or plasma cell neoplasm Flow cytometry showed no significant CD34 positive blastic population.  No monoclonal B cell population or significant T-cell abnormalities  September 27 2022:  Scheduled follow up.  Reviewed result of labs with patient.  She is still taking prednisone.   Telephone visit  Consider ZE syndrome, Acromegaly, Adrenal insufficiency  Staghorn kidney stones usually magnesium ammonium phosphate.    Review of Systems  Constitutional:  Negative for chills, fatigue and fever.       Has gained 10 to 15 lbs since starting prednisone  HENT:   Negative for lump/mass, mouth sores, nosebleeds, sore throat, trouble swallowing and voice change.   Eyes:  Negative for eye problems and icterus.       Vision changes:  None  Respiratory:  Negative for chest tightness, hemoptysis, shortness of breath and wheezing.        Slight cough.  Nonproductive  Cardiovascular:  Negative for chest pain and leg swelling.       Palpitations resolved with improvement of hypercalcemia  Gastrointestinal:  Negative for abdominal pain, blood in stool, constipation, diarrhea, nausea and vomiting.  Occasional heaviness LLQ  Endocrine: Negative for hot flashes.       Cold intolerance:  none Heat intolerance:  none  Genitourinary:  Negative for bladder incontinence, difficulty urinating, dysuria, frequency, hematuria and nocturia.   Musculoskeletal:  Negative for arthralgias, back pain, gait problem, myalgias, neck pain and neck stiffness.  Skin:   Negative for itching and wound.       Has eczema exacerbated by sweating  Neurological:  Negative for extremity weakness, gait problem, headaches, light-headedness, numbness, seizures and speech difficulty.  Hematological:  Negative for adenopathy. Does not bruise/bleed easily.  Psychiatric/Behavioral:  Negative for sleep disturbance and suicidal ideas. The patient is not nervous/anxious.     MEDICAL HISTORY: Past Medical History:  Diagnosis Date   Anemia    Anxiety    COVID-19 08/2019   cough, headache, fever, all symptoms resolved per pt on 12/20/20   Headache    History of kidney stones    Hypertension    Pulmonary embolism (HCC) 08/2018   bilateral, pt states PEs were due to [facility], pt stated that she was on blood thinners for 6 months and embolisms resolved per 12/20/20 call with pt   Stress incontinence    Syncope and collapse     SURGICAL HISTORY: Past Surgical History:  Procedure Laterality Date   AUGMENTATION MAMMAPLASTY Bilateral 2008   BREAST ENHANCEMENT SURGERY  2008   CYSTOSCOPY WITH RETROGRADE PYELOGRAM, URETEROSCOPY AND STENT PLACEMENT Left 07/06/2020   Procedure: CYSTOSCOPY WITH RETROGRADE PYELOGRAM, URETEROSCOPY AND STENT PLACEMENT;  Surgeon: Crista Elliot, MD;  Location: [facility];  Service: Urology;  Laterality: Left;   CYSTOSCOPY WITH STENT PLACEMENT Left 09/19/2017   Procedure: CYSTOSCOPY WITH STENT PLACEMENT;  Surgeon: Ihor Gully, MD;  Location: [facility];  Service: Urology;  Laterality: Left;   CYSTOSCOPY/URETEROSCOPY/HOLMIUM LASER/STENT PLACEMENT Right 12/25/2020   Procedure: CYSTOSCOPY RIGHT URETEROSCOPY/, RETROGRADE, HOLMIUM LASER/STENT PLACEMENT;  Surgeon: Crista Elliot, MD;  Location: [facility];  Service: Urology;  Laterality: Right;   EXTRACORPOREAL SHOCK WAVE LITHOTRIPSY Left 10/16/2017   Procedure: LEFT EXTRACORPOREAL SHOCK WAVE LITHOTRIPSY (ESWL);  Surgeon: Ihor Gully, MD;  Location: [facility];  Service: Urology;  Laterality:  Left;   HOLMIUM LASER APPLICATION Right 12/25/2020   Procedure: HOLMIUM LASER APPLICATION;  Surgeon: Crista Elliot, MD;  Location[facility];  Service: Urology;  Laterality: Right;   IR URETERAL STENT LEFT NEW ACCESS W/O SEP NEPHROSTOMY CATH  09/04/2020   NEPHROLITHOTOMY Left 09/04/2020   Procedure: LEFT NEPHROLITHOTOMY PERCUTANEOUS POSSIBLE URETEROSCOPY WITH STENT PLACEMENT;  Surgeon: Crista Elliot, MD;  Location: [facility];  Service: Urology;  Laterality: Left;  REQUESTING 2 HRS   TUBAL LIGATION Bilateral 2006   WISDOM TOOTH EXTRACTION  2020   2 top wisdom teeth removed    SOCIAL HISTORY: Social History   Socioeconomic History   Marital status: Married    Spouse name: Not on file   Number of children: 3   Years of education: Not on file   Highest education level: Not on file  Occupational History   Not on file  Tobacco Use   Smoking status: Former    Current packs/day: 0.00    Average packs/day: 1 pack/day for 10.0 years (10.0 ttl pk-yrs)    Types: Cigarettes    Start date: 110    Quit date: 2005    Years since quitting: 19.6   Smokeless tobacco: Never  Vaping Use   Vaping status: Never Used  Substance  and Sexual Activity   Alcohol use: Yes    Alcohol/week: 1.0 standard drink of alcohol    Types: 1 Glasses of wine per week    Comment: 1 - 2 glasses of wine per week   Drug use: No   Sexual activity: Yes  Other Topics Concern   Not on file  Social History Narrative   Not on file   Social Determinants of Health   Financial Resource Strain: Not on file  Food Insecurity: Not on file  Transportation Needs: Not on file  Physical Activity: Not on file  Stress: Not on file  Social Connections: Not on file  Intimate Partner Violence: Not on file    FAMILY HISTORY Family History  Problem Relation Age of Onset   Lung cancer Father    Luiz Blare' disease Brother    Schizophrenia Brother    Depression Paternal Uncle    Breast cancer Maternal  Grandmother    Alzheimer's disease Maternal Grandmother    Breast cancer Paternal Grandmother     ALLERGIES:  has No Known Allergies.  MEDICATIONS:  Current Outpatient Medications  Medication Sig Dispense Refill   acetaminophen (TYLENOL) 500 MG tablet Take 1,000 mg by mouth every 6 (six) hours as needed for mild pain, fever or headache.     alendronate (FOSAMAX) 70 MG tablet Take 70 mg by mouth once a week.     aspirin-acetaminophen-caffeine (EXCEDRIN MIGRAINE) 250-250-65 MG tablet Take 1 tablet by mouth daily as needed for migraine.     Biotin 5000 MCG CAPS Take 1 capsule by mouth daily.     loratadine (CLARITIN) 10 MG tablet Take 10 mg by mouth daily.     predniSONE (DELTASONE) 20 MG tablet Take 20 mg by mouth daily.     SUMAtriptan (IMITREX) 50 MG tablet Take 1 tablet (50 mg total) by mouth every 2 (two) hours as needed for migraine. May repeat in 2 hours if headache persists or recurs. 10 tablet 3   triamcinolone cream (KENALOG) 0.1 % Apply 1 application topically daily as needed (eczema).     No current facility-administered medications for this visit.    PHYSICAL EXAMINATION:  ECOG PERFORMANCE STATUS: 1 - Symptomatic but completely ambulatory   There were no vitals filed for this visit.   There were no vitals filed for this visit.    Physical Exam  A physical exam could not be conducted as part of a telephone visit  LABORATORY DATA: I have personally reviewed the data as listed:  Hospital Outpatient Visit on 09/10/2022  Component Date Value Ref Range Status   WBC 09/10/2022 9.6  4.0 - 10.5 K/uL Final   RBC 09/10/2022 4.31  3.87 - 5.11 MIL/uL Final   Hemoglobin 09/10/2022 13.1  12.0 - 15.0 g/dL Final   HCT 16/11/9602 40.6  36.0 - 46.0 % Final   MCV 09/10/2022 94.2  80.0 - 100.0 fL Final   MCH 09/10/2022 30.4  26.0 - 34.0 pg Final   MCHC 09/10/2022 32.3  30.0 - 36.0 g/dL Final   RDW 54/10/8117 12.4  11.5 - 15.5 % Final   Platelets 09/10/2022 257  150 - 400 K/uL  Final   nRBC 09/10/2022 0.0  0.0 - 0.2 % Final   Neutrophils Relative % 09/10/2022 54  % Final   Neutro Abs 09/10/2022 5.4  1.7 - 7.7 K/uL Final   Lymphocytes Relative 09/10/2022 34  % Final   Lymphs Abs 09/10/2022 3.2  0.7 - 4.0 K/uL Final   Monocytes Relative  09/10/2022 8  % Final   Monocytes Absolute 09/10/2022 0.7  0.1 - 1.0 K/uL Final   Eosinophils Relative 09/10/2022 2  % Final   Eosinophils Absolute 09/10/2022 0.2  0.0 - 0.5 K/uL Final   Basophils Relative 09/10/2022 1  % Final   Basophils Absolute 09/10/2022 0.1  0.0 - 0.1 K/uL Final   Immature Granulocytes 09/10/2022 1  % Final   Abs Immature Granulocytes 09/10/2022 0.05  0.00 - 0.07 K/uL Final   Performed at [facility], [address], [place], [place] [address]   SURGICAL PATHOLOGY 09/10/2022    Final-Edited                   Value:Surgical Pathology CASE: [id number] PATIENT: Nannie Gurka Bone Marrow Report     Clinical History: Hypercalcemia     DIAGNOSIS:  BONE MARROW, ASPIRATE, CLOT, CORE: -Normocellular bone marrow with trilineage hematopoiesis -See comment  PERIPHERAL BLOOD: -No significant morphologic abnormalities  COMMENT:  The bone marrow is generally normocellular for age with trilineage hematopoiesis and nonspecific changes.  No granulomata are seen and there is no evidence of a lymphoproliferative disorder or plasma cell neoplasm.  Correlation with cytogenetic studies is recommended.  MICROSCOPIC DESCRIPTION:  PERIPHERAL BLOOD SMEAR: The red blood cells display mild anisopoikilocytosis with minimal polychromasia.  The white blood cells are normal in number with scattered neutrophils displaying mild toxic granulation.  The platelets are normal in number.  BONE MARROW ASPIRATE: Bone marrow particles present Erythroid precursors: Orderly and progressive maturation                          Granulocytic precursors: Orderly and progressive maturation Megakaryocytes:  Abundant with scattered large or small and/or hypolobated forms Lymphocytes/plasma cells: Large aggregates not present  TOUCH PREPARATIONS: A mixture of cell types present  CLOT AND BIOPSY: The bone marrow generally shows 50 to 60% cellularity with a mixture of cell types.  Large lymphoid or plasma cell aggregates are not seen.  No granulomata are present.  Immunohistochemical stain for CD138 and in situ hybridization for kappa and lambda were performed with appropriate controls.  CD138 highlights a minor plasma cell component consisting of interstitial cells and scattered small clusters and displays polyclonal staining pattern for kappa and lambda light chains.  IRON STAIN: Iron stains are performed on a bone marrow aspirate or touch imprint smear and section of clot. The controls stained appropriately.       Storage Iron: Decreased      Ring Sideroblasts: Absent  ADDITIONAL DATA/TESTIN                         G: The specimen was sent for cytogenetic analysis and culture studies and separate reports will follow.  Flow cytometric analysis was performed [facility] [phone]) and failed to show any significant CD34 positive blastic population, monoclonal B-cell population, or significant T-cell abnormalities.  CELL COUNT DATA:  Bone Marrow count performed on 500 cells shows: Blasts:   0%   Myeloid:  66% Promyelocytes: 2%   Erythroid:     25% Myelocytes:    8%   Lymphocytes:   6% Metamyelocytes:     9%   Plasma cells:  2% Bands:    7% Neutrophils:   37%  M:E ratio:     2.64 Eosinophils:   3% Basophils:     0% Monocytes:     1%  Lab Data: CBC performed  on 09/10/2022 shows: WBC: 9.6 k/uL  Neutrophils:   52% Hgb: 13.1 g/dL Lymphocytes:   23% HCT: 40.6 %    Monocytes:     7% MCV: 94.2 fL   Eosinophils:   2% RDW: 12.4 %    Basophils:     1% PLT: 257 k/uL    GROSS DESCRIPTION:  A: Aspirate smear  B: Received in B-plus fixative are tissue fragments measuring 1 x 0.6 x 0.2 cm.                            The specimen is submitted in toto.  C: Received in B-plus fixative is a 2 x 0.2 cm core of bone.  The specimen is submitted in toto following decalcification.  [facility] 09/10/2022)   Final Diagnosis performed by Guerry Bruin, MD.   Electronically signed 09/13/2022 Technical and / or Professional components performed at [facility], [address] [place], [place] [address].  Immunohistochemistry Technical component (if applicable) was performed at [facility]. [address], [place], [place] [address].   IMMUNOHISTOCHEMISTRY DISCLAIMER (if applicable): Some of these immunohistochemical stains may have been developed and the performance characteristics determine by [facility]. Some may not have been cleared or approved by the U.S. Food and Drug Administration. The FDA has determined that such clearance or approval is not necessary. This test is used for clinical purposes. It should not be regarded as investigatio                         nal or for research. This laboratory is certified under the Clinical Laboratory Improvement Amendments of 1988 (CLIA-88) as qualified to perform high complexity clinical laboratory testing.  The controls stained appropriately.   IHC stains are performed on formalin fixed, paraffin embedded tissue using a 3,3"diaminobenzidine (DAB) chromogen and Leica Bond Autostainer System. The staining intensity of the nucleus is score manually and is reported as the percentage of tumor cell nuclei demonstrating specific nuclear staining. The specimens are fixed in 10% Neutral Formalin for at least 6 hours and up to 72hrs. These tests are validated on decalcified tissue. Results should be interpreted with caution given the possibility of false negative results on decalcified specimens. Antibody Clones are as follows ER-clone 4F, PR-clone 16, Ki67- clone MM1. Some of  these immunohistochemical stains may have been developed and the performance characteristics determined by [facility].    SURGICAL PATHOLOGY 09/10/2022    Final-Edited                   Value:Surgical Pathology CASE: [id number] PATIENT: Asjah Jupin Flow Pathology Report     Clinical history: Hypercalcemia     DIAGNOSIS:  -No significant CD34 positive blastic population identified -No monoclonal B-cell population or significant T-cell abnormalities identified   GATING AND PHENOTYPIC ANALYSIS:  Gated population: Flow cytometric immunophenotyping is performed using antibodies to the antigens listed in the table below. Electronic gates are placed around a cell cluster displaying light scatter properties corresponding to: lymphocytes, blasts  Abnormal Cells in gated population: N/A  Phenotype of Abnormal Cells: N/A                         Lymphoid Antigens  Myeloid Antigens Miscellaneous CD2  tested    CD10 tested    CD11b     ND   CD45 tested CD3  tested    CD19 tested    CD11c     ND   HLA-Dr    ND CD4  tested    CD20 tested    CD13 ND   CD34 tested CD5  tested    CD22 ND   CD14 ND   CD38 tested CD7  tested    CD79b     ND   C                         D15 ND   CD138     ND CD8  tested    CD103     ND   CD16 ND   TdT  ND CD25 ND   CD200     tested    CD33 ND   CD123     ND TCRab     ND   sKappa    tested    CD64 ND   CD41 ND TCRgd     tested    sLambda   tested    CD117     ND   CD61 ND CD56 tested    cKappa    ND   MPO  ND   CD71 ND CD57 ND   cLambda   ND        [id number]     GROSS DESCRIPTION:  Reference Bone Marrow case [id number].    Final Diagnosis performed by Guerry Bruin, MD.   Electronically signed 09/13/2022 Technical and / or Professional components performed at [facility], [address] [place], [place] [address].  The above tests were developed and their performance  characteristics determined by the [facility] system for the physical and immunophenotypic characterization of cell populations. They have not been cleared by the U.S. Food and Drug administration. The  FDA has determined that such clearance or approval is not necessary. This test is used for clinical purposes. It                          should not be  regarded as investigational or for research   Hospital Outpatient Visit on 09/02/2022  Component Date Value Ref Range Status   Glucose-Capillary 09/02/2022 89  70 - 99 mg/dL Final   Glucose reference range applies only to samples taken after fasting for at least 8 hours.    RADIOGRAPHIC STUDIES: I have personally reviewed the radiological images as listed and agree with the findings in the report  No results found.  ASSESSMENT/PLAN  42 y.o. female is here because of  hypercalcemia.  Medical history notable for anemia, nephrolithiasis, hypertension, pulmonary embolism in July 2020 while on oral contraceptives, syncope.  Hypercalcemia-  Possible causes  Inherited variants Multiple endocrine neoplasia (MEN) syndromes, Familial isolated hyperparathyroidism, Hyperparathyroidism-jaw tumor syndrome  Familial hypocalciuric hypercalcemia   Non-parathyroid mediated  Chronic granulomatous disorders Increased calcitriol (activation of extrarenal 1-alpha-hydroxylase)   Medications Thiazide diuretics Teriparatide Abaloparatide    Miscellaneous Hyperthyroidism Acromegaly Pheochromocytoma Adrenal insufficiency Parenteral nutrition    Evaluation to date   May 20, 2022: SPEP showed no evidence of paraprotein.  1,25 dihydroxy vitamin D 113 (high) PTH RP negative.  PTH 9 (low) May 31, 2022: ACE level 71 (normal) June 04, 2022: HIV negative.  RPR negative June 05, 2022: CT  chest no evidence of adenopathy or pulmonary disease   Jun 13, 2022: CRP less than 1 Jun 26, 2022 CT abdomen pelvis-notable for bilateral nephrolithiasis   July 25, 2022:  QuantiFERON-TB gold negative  August 09 2022- Not taking calcium, Vitamin D or supplements.  Placed on prednisone and alendronate by Endicrinologist.    August 09 2022- SPEP with IEP showed no paraprotein serum free kappa 19.8 lambda 25.9 with a ratio of 0.76 PTH intact not performed IgG 1057 IgA 163 IgM 119 1, 25-hydroxy vitamin D 74.  25-hydroxy vitamin D 52.2.  Vitamin A 58 LDH 255    Pregnancy test negative. CMP notable for glucose 126 BUN 25 creatinine 1.23 calcium 10.5    September 10, 2022: Bone marrow biopsy and aspirate--normocellular bone marrow with trilineage hematopoiesis.   No granulomas seen.  No evidence of lymphoproliferative or plasma cell neoplasm Flow cytometry showed no significant CD34 positive blastic population.  No monoclonal B cell population or significant T-cell abnormalities    September 27 2022- Caveat- Absence of granulomas in bone marrow can be due to long term use of steroids and therefore does not exclude a diagnosis of sarcoid.  Would consider evaluation for ZE syndrome, acromegaly, adrenal insufficiency   Staghorn kidney stones can be composed of calcium carbonate apitate- Nephrology referral may be reasonable to evaluate for possible metabolic causes     Cancer Staging  No matching staging information was found for the patient.    No problem-specific Assessment & Plan notes found for this encounter.    No orders of the defined types were placed in this encounter.   40  minutes was spent in patient care.  This included time spent preparing to see the patient (e.g., review of tests), obtaining and/or reviewing separately obtained history, counseling and educating the patient/family/caregiver, ordering medications, tests, or procedures; documenting clinical information in the electronic or other health record, independently interpreting results and communicating results to the patient/family/caregiver as well as coordination of care.       All questions were  answered. The patient knows to call the clinic with any problems, questions or concerns.  This note was electronically signed.    Loni Muse, MD  09/26/2022 2:13 PM

## 2022-09-27 ENCOUNTER — Inpatient Hospital Stay: Payer: No Typology Code available for payment source | Attending: Oncology | Admitting: Oncology

## 2022-09-27 DIAGNOSIS — N200 Calculus of kidney: Secondary | ICD-10-CM | POA: Diagnosis not present

## 2022-09-27 DIAGNOSIS — Z86711 Personal history of pulmonary embolism: Secondary | ICD-10-CM | POA: Diagnosis not present

## 2022-09-27 DIAGNOSIS — Z87891 Personal history of nicotine dependence: Secondary | ICD-10-CM | POA: Insufficient documentation

## 2022-09-27 DIAGNOSIS — Z9889 Other specified postprocedural states: Secondary | ICD-10-CM

## 2022-09-27 DIAGNOSIS — Z803 Family history of malignant neoplasm of breast: Secondary | ICD-10-CM | POA: Diagnosis not present

## 2022-09-27 DIAGNOSIS — Z801 Family history of malignant neoplasm of trachea, bronchus and lung: Secondary | ICD-10-CM | POA: Diagnosis not present

## 2022-10-02 ENCOUNTER — Other Ambulatory Visit: Payer: Self-pay

## 2022-10-02 DIAGNOSIS — N200 Calculus of kidney: Secondary | ICD-10-CM

## 2022-10-28 DIAGNOSIS — Z9889 Other specified postprocedural states: Secondary | ICD-10-CM | POA: Insufficient documentation

## 2022-11-11 ENCOUNTER — Other Ambulatory Visit: Payer: Self-pay | Admitting: Obstetrics and Gynecology

## 2022-11-15 LAB — SURGICAL PATHOLOGY

## 2022-12-02 ENCOUNTER — Telehealth: Payer: Self-pay | Admitting: Oncology

## 2022-12-02 NOTE — Telephone Encounter (Signed)
called patient to reschedule with new provider per 10/21 secure chat. Patient stated will continue to follow up with endocrinologist and was under the impression she was discharged from care with Dr. Angelene Giovanni. Patient stated does not want new MD.

## 2022-12-05 ENCOUNTER — Inpatient Hospital Stay: Payer: Self-pay | Admitting: Internal Medicine

## 2022-12-05 ENCOUNTER — Inpatient Hospital Stay: Payer: Self-pay

## 2022-12-17 ENCOUNTER — Other Ambulatory Visit: Payer: Self-pay | Admitting: Internal Medicine

## 2022-12-17 DIAGNOSIS — N644 Mastodynia: Secondary | ICD-10-CM

## 2023-01-22 ENCOUNTER — Ambulatory Visit
Admission: RE | Admit: 2023-01-22 | Discharge: 2023-01-22 | Disposition: A | Discharge status: Home / Self Care | Payer: No Typology Code available for payment source | Source: Ambulatory Visit | Attending: Internal Medicine | Admitting: Internal Medicine

## 2023-01-22 ENCOUNTER — Other Ambulatory Visit: Payer: Self-pay | Admitting: Internal Medicine

## 2023-01-22 ENCOUNTER — Ambulatory Visit
Admission: RE | Admit: 2023-01-22 | Discharge: 2023-01-22 | Disposition: A | Discharge status: Home / Self Care | Payer: PRIVATE HEALTH INSURANCE | Source: Ambulatory Visit | Attending: Internal Medicine | Admitting: Internal Medicine

## 2023-01-22 DIAGNOSIS — N644 Mastodynia: Secondary | ICD-10-CM

## 2023-01-29 ENCOUNTER — Ambulatory Visit: Payer: No Typology Code available for payment source | Attending: Cardiology | Admitting: Cardiology

## 2023-01-29 ENCOUNTER — Encounter: Payer: Self-pay | Admitting: Cardiology

## 2023-01-29 VITALS — BP 116/76 | HR 87 | Ht 69.0 in | Wt 203.0 lb

## 2023-01-29 DIAGNOSIS — R000 Tachycardia, unspecified: Secondary | ICD-10-CM

## 2023-01-29 DIAGNOSIS — R002 Palpitations: Secondary | ICD-10-CM | POA: Diagnosis not present

## 2023-01-29 NOTE — Progress Notes (Signed)
Cardiology Office Note:    Date:  01/29/2023   ID:  Kathryn Crawford, DOB 1980/08/01, MRN [id number]  PCP:  Ollen Bowl, MD  Cardiologist:  Little Ishikawa, MD  Electrophysiologist:  None   Referring MD: Ollen Bowl, MD   Chief Complaint  Patient presents with   Palpitations    History of Present Illness:    Kathryn Crawford is a 42 y.o. female with a hx of pulmonary embolism, hypertension, nephrolithiasis who is referred by Dr Jacqulyn Bath for evaluation of palpitations.  She reports she has been having palpitations occurring multiple times per week.  She will be resting then feel heart is racing.  She wore a monitor for 6 days in March 2024, was told she had SVT.  States that palpitations have improved since then.  She has had extensive workup done for hypercalcemia, and was started on prednisone by her endocrinologist Dr. Sharl Ma, and reports improvement in palpitations since that time.  She also reports chest pain that she describes as occurring about once per month, describes sharp pain on left side of chest that lasts for few seconds.  Not related to exertion.  She was having shortness of breath but has improved with treatment of her hypercalcemia.  Also having lightheadedness and near syncopal episodes but have also improved.  She smoked for 10 years about 1 pack/day, quit age 26.  Family history includes father had CABG at 67.    Past Medical History:  Diagnosis Date   Anemia    Anxiety    COVID-19 08/2019   cough, headache, fever, all symptoms resolved per pt on 12/20/20   Headache    History of kidney stones    Hypertension    Pulmonary embolism (HCC) 08/2018   bilateral, pt states PEs were due to [facility], pt stated that she was on blood thinners for 6 months and embolisms resolved per 12/20/20 call with pt   Stress incontinence    Syncope and collapse     Past Surgical History:  Procedure Laterality Date   AUGMENTATION MAMMAPLASTY Bilateral 2008    BREAST ENHANCEMENT SURGERY  2008   CYSTOSCOPY WITH RETROGRADE PYELOGRAM, URETEROSCOPY AND STENT PLACEMENT Left 07/06/2020   Procedure: CYSTOSCOPY WITH RETROGRADE PYELOGRAM, URETEROSCOPY AND STENT PLACEMENT;  Surgeon: Crista Elliot, MD;  Location: [facility];  Service: Urology;  Laterality: Left;   CYSTOSCOPY WITH STENT PLACEMENT Left 09/19/2017   Procedure: CYSTOSCOPY WITH STENT PLACEMENT;  Surgeon: Ihor Gully, MD;  Location: [facility];  Service: Urology;  Laterality: Left;   CYSTOSCOPY/URETEROSCOPY/HOLMIUM LASER/STENT PLACEMENT Right 12/25/2020   Procedure: CYSTOSCOPY RIGHT URETEROSCOPY/, RETROGRADE, HOLMIUM LASER/STENT PLACEMENT;  Surgeon: Crista Elliot, MD;  Location: [facility];  Service: Urology;  Laterality: Right;   EXTRACORPOREAL SHOCK WAVE LITHOTRIPSY Left 10/16/2017   Procedure: LEFT EXTRACORPOREAL SHOCK WAVE LITHOTRIPSY (ESWL);  Surgeon: Ihor Gully, MD;  Location: [facility];  Service: Urology;  Laterality: Left;   HOLMIUM LASER APPLICATION Right 12/25/2020   Procedure: HOLMIUM LASER APPLICATION;  Surgeon: Crista Elliot, MD;  Location: [facility];  Service: Urology;  Laterality: Right;   IR URETERAL STENT LEFT NEW ACCESS W/O SEP NEPHROSTOMY CATH  09/04/2020   NEPHROLITHOTOMY Left 09/04/2020   Procedure: LEFT NEPHROLITHOTOMY PERCUTANEOUS POSSIBLE URETEROSCOPY WITH STENT PLACEMENT;  Surgeon: Crista Elliot, MD;  Location: [facility];  Service: Urology;  Laterality: Left;  REQUESTING 2 HRS   TUBAL LIGATION Bilateral 2006   WISDOM TOOTH EXTRACTION  2020  2 top wisdom teeth removed    Current Medications: Current Meds  Medication Sig   acetaminophen (TYLENOL) 500 MG tablet Take 1,000 mg by mouth every 6 (six) hours as needed for mild pain, fever or headache.   alendronate (FOSAMAX) 70 MG tablet Take 70 mg by mouth once a week.   aspirin-acetaminophen-caffeine (EXCEDRIN MIGRAINE) 250-250-65 MG tablet Take 1 tablet by mouth daily as needed for  migraine.   Biotin 5000 MCG CAPS Take 1 capsule by mouth daily.   loratadine (CLARITIN) 10 MG tablet Take 10 mg by mouth daily.   predniSONE (DELTASONE) 20 MG tablet Take 20 mg by mouth daily.   SUMAtriptan (IMITREX) 50 MG tablet Take 1 tablet (50 mg total) by mouth every 2 (two) hours as needed for migraine. May repeat in 2 hours if headache persists or recurs.   triamcinolone cream (KENALOG) 0.1 % Apply 1 application topically daily as needed (eczema).     Allergies:   Patient has no known allergies.   Social History   Socioeconomic History   Marital status: Married    Spouse name: Not on file   Number of children: 3   Years of education: Not on file   Highest education level: Not on file  Occupational History   Not on file  Tobacco Use   Smoking status: Former    Current packs/day: 0.00    Average packs/day: 1 pack/day for 10.0 years (10.0 ttl pk-yrs)    Types: Cigarettes    Start date: 47    Quit date: 2005    Years since quitting: 19.9   Smokeless tobacco: Never  Vaping Use   Vaping status: Never Used  Substance and Sexual Activity   Alcohol use: Yes    Alcohol/week: 1.0 standard drink of alcohol    Types: 1 Glasses of wine per week    Comment: 1 - 2 glasses of wine per week   Drug use: No   Sexual activity: Yes  Other Topics Concern   Not on file  Social History Narrative   Not on file   Social Drivers of Health   Financial Resource Strain: Not on file  Food Insecurity: Not on file  Transportation Needs: Not on file  Physical Activity: Not on file  Stress: Not on file  Social Connections: Not on file     Family History: The patient's family history includes Alzheimer's disease in her maternal grandmother; Breast cancer in her maternal grandmother and paternal grandmother; Depression in her paternal uncle; Luiz Blare' disease in her brother; Lung cancer in her father; Schizophrenia in her brother.  ROS:   Please see the history of present illness.     All  other systems reviewed and are negative.  EKGs/Labs/Other Studies Reviewed:    The following studies were reviewed today:   EKG:   01/29/2023: Normal sinus rhythm, rate 87, no ST abnormalities  Recent Labs: 08/09/2022: ALT 14; BUN 25; Creatinine 1.23; Potassium 3.8; Sodium 139 09/10/2022: Hemoglobin 13.1; Platelets 257  Recent Lipid Panel No results found for: "CHOL", "TRIG", "HDL", "CHOLHDL", "VLDL", "LDLCALC", "LDLDIRECT"  Physical Exam:    VS:  BP 116/76   Pulse 87   Ht 5\' 9"  (1.753 m)   Wt 203 lb (92.1 kg)   SpO2 97%   BMI 29.98 kg/m     Wt Readings from Last 3 Encounters:  01/29/23 203 lb (92.1 kg)  09/10/22 194 lb (88 kg)  08/09/22 196 lb 11.2 oz (89.2 kg)     GEN:  Well nourished, well developed in no acute distress HEENT: Normal NECK: No JVD; No carotid bruits LYMPHATICS: No lymphadenopathy CARDIAC: RRR, no murmurs, rubs, gallops RESPIRATORY:  Clear to auscultation without rales, wheezing or rhonchi  ABDOMEN: Soft, non-tender, non-distended MUSCULOSKELETAL:  No edema; No deformity  SKIN: Warm and dry NEUROLOGIC:  Alert and oriented x 3 PSYCHIATRIC:  Normal affect   ASSESSMENT:    1. Palpitations   2. Tachycardia   3. Hypercalcemia    PLAN:    Palpitations/tachycardia: Reports she wore a ZIO monitor in March 2024 and was told having episodes of SVT.  States that has improved since she started on prednisone for treatment of her hypercalcemia.  Will obtain ZIO report.  Check echocardiogram  Hypercalcemia: Follows with Dr. Sharl Ma in endocrinology.  Unclear cause.  She has been started on prednisone with improvement.  There was concern for possible sarcoid.  Will check echocardiogram to evaluate for any structural heart disease.  RTC in 3 months   Medication Adjustments/Labs and Tests Ordered: Current medicines are reviewed at length with the patient today.  Concerns regarding medicines are outlined above.  Orders Placed This Encounter  Procedures   EKG  12-Lead   ECHOCARDIOGRAM COMPLETE   No orders of the defined types were placed in this encounter.   Patient Instructions  Medication Instructions:    *If you need a refill on your cardiac medications before your next appointment, please call your pharmacy*   Lab Work:  If you have labs (blood work) drawn today and your tests are completely normal, you will receive your results only by: MyChart Message (if you have MyChart) OR A paper copy in the mail If you have any lab test that is abnormal or we need to change your treatment, we will call you to review the results.   Testing/Procedures:  Will be schedule at [address] Your physician has requested that you have an echocardiogram. Echocardiography is a painless test that uses sound waves to create images of your heart. It provides your doctor with information about the size and shape of your heart and how well your heart's chambers and valves are working. This procedure takes approximately one hour. There are no restrictions for this procedure. Please do NOT wear cologne, perfume, aftershave, or lotions (deodorant is allowed). Please arrive 15 minutes prior to your appointment time.  Please note: We ask at that you not bring children with you during ultrasound (echo/ vascular) testing. Due to room size and safety concerns, children are not allowed in the ultrasound rooms during exams. Our front office staff cannot provide observation of children in our lobby area while testing is being conducted. An adult accompanying a patient to their appointment will only be allowed in the ultrasound room at the discretion of the ultrasound technician under special circumstances. We apologize for any inconvenience.    Follow-Up: At [facility], you and your health needs are our priority.  As part of our continuing mission to provide you with exceptional heart care, we have created designated Provider Care Teams.  These  Care Teams include your primary Cardiologist (physician) and Advanced Practice Providers (APPs -  Physician Assistants and Nurse Practitioners) who all work together to provide you with the care you need, when you need it.     Your next appointment:   3 month(s)  The format for your next appointment:   In Person  Provider:   Little Ishikawa, MD  Signed, Little Ishikawa, MD  01/29/2023 10:45 AM    [facility]

## 2023-01-29 NOTE — Patient Instructions (Addendum)
Medication Instructions:    *If you need a refill on your cardiac medications before your next appointment, please call your pharmacy*   Lab Work:  If you have labs (blood work) drawn today and your tests are completely normal, you will receive your results only by: MyChart Message (if you have MyChart) OR A paper copy in the mail If you have any lab test that is abnormal or we need to change your treatment, we will call you to review the results.   Testing/Procedures:  Will be schedule at [address] Your physician has requested that you have an echocardiogram. Echocardiography is a painless test that uses sound waves to create images of your heart. It provides your doctor with information about the size and shape of your heart and how well your heart's chambers and valves are working. This procedure takes approximately one hour. There are no restrictions for this procedure. Please do NOT wear cologne, perfume, aftershave, or lotions (deodorant is allowed). Please arrive 15 minutes prior to your appointment time.  Please note: We ask at that you not bring children with you during ultrasound (echo/ vascular) testing. Due to room size and safety concerns, children are not allowed in the ultrasound rooms during exams. Our front office staff cannot provide observation of children in our lobby area while testing is being conducted. An adult accompanying a patient to their appointment will only be allowed in the ultrasound room at the discretion of the ultrasound technician under special circumstances. We apologize for any inconvenience.    Follow-Up: At [facility], you and your health needs are our priority.  As part of our continuing mission to provide you with exceptional heart care, we have created designated Provider Care Teams.  These Care Teams include your primary Cardiologist (physician) and Advanced Practice Providers (APPs -  Physician Assistants and Nurse  Practitioners) who all work together to provide you with the care you need, when you need it.     Your next appointment:   3 month(s)  The format for your next appointment:   In Person  Provider:   Little Ishikawa, MD

## 2023-02-27 ENCOUNTER — Ambulatory Visit (HOSPITAL_COMMUNITY): Payer: No Typology Code available for payment source | Attending: Internal Medicine

## 2023-02-27 DIAGNOSIS — R000 Tachycardia, unspecified: Secondary | ICD-10-CM | POA: Diagnosis present

## 2023-02-27 DIAGNOSIS — I10 Essential (primary) hypertension: Secondary | ICD-10-CM

## 2023-02-27 LAB — ECHOCARDIOGRAM COMPLETE
Calc EF: 53.8 %
Est EF: 60
S' Lateral: 2.85 cm
Single Plane A2C EF: 56.7 %
Single Plane A4C EF: 50.5 %

## 2023-05-02 ENCOUNTER — Ambulatory Visit: Payer: No Typology Code available for payment source | Admitting: Cardiology

## 2023-05-05 ENCOUNTER — Ambulatory Visit: Payer: No Typology Code available for payment source | Attending: Emergency Medicine | Admitting: Emergency Medicine

## 2023-05-05 NOTE — Progress Notes (Deleted)
  Cardiology Office Note:    Date:  05/05/2023  ID:  Kathryn Crawford, DOB 06-Jan-1981, MRN [id number] PCP: Ollen Bowl, MD  [facility] Providers Cardiologist:  Little Ishikawa, MD { Click to update primary MD,subspecialty MD or APP then REFRESH:1}    {Click to Open Review  :1}   Patient Profile:      Chief Complaint: *** History of Present Illness:  Kathryn Crawford is a 43 y.o. female with visit-pertinent history of pulmonary embolism attributed to OCP use, hypertension, nephrolithiasis, SVT, hypercalcemia.  She established with cardiology service on 01/29/2023 for evaluation of palpitations.  She had noted to have a palpitations multiple times per week.  She will be resting and felt her heart is racing.  She did wear a monitor for 6 days in March 2024, was told she had SVT.  Of note she did have extensive workup done for hypercalcemia, was started on prednisone by her endocrinologist and reported improvement in her palpitations since that time.  She also reported chest pain that she describes occurring about once per month that was unrelated to exertion.  She also reported having shortness of breath but this had improved with treatment of her hypercalcemia.  Echocardiogram was ordered and completed on 02/2023 showing LVEF 60%, no RWMA, normal diastolic parameters, RV function and size normal, trivial MR.  She was seen by rheumatology on 02/2023 who noted "has had a thorough workup for possible etiologies of her hypercalcemia including PET/CT scan, bone marrow biopsy, cross sectional chest imaging, infectious workup, STI testing, and monoclonal gammopathy workup without a clear cause identified. She does have 1, 25 vitamin D elevation which indicates granulomatous involvement." "If this workup is unrevealing, I worry there could be ongoing granulomatous inflammation is occurring secondary to her silicone buttock injections."   Discussed the use of AI scribe software  for clinical note transcription with the patient, who gave verbal consent to proceed.  History of Present Illness            Review of systems:  Please see the history of present illness. All other systems are reviewed and otherwise negative. ***     Home Medications:    No outpatient medications have been marked as taking for the 05/05/23 encounter (Appointment) with Denyce Robert, NP.   Studies Reviewed:       *** Risk Assessment/Calculations:   {Does this patient have ATRIAL FIBRILLATION?[phone]} No BP recorded.  {Refresh Note OR Click here to enter BP  :1}***       Physical Exam:   VS:  There were no vitals taken for this visit.   Wt Readings from Last 3 Encounters:  01/29/23 203 lb (92.1 kg)  09/10/22 194 lb (88 kg)  08/09/22 196 lb 11.2 oz (89.2 kg)    GEN: Well nourished, well developed in no acute distress NECK: No JVD; No carotid bruits CARDIAC: ***RRR, no murmurs, rubs, gallops RESPIRATORY:  Clear to auscultation without rales, wheezing or rhonchi  ABDOMEN: Soft, non-tender, non-distended EXTREMITIES:  No edema; No acute deformity ***     Assessment and Plan:  Assessment and Plan              {Are you ordering a CV Procedure (e.g. stress test, cath, DCCV, TEE, etc)?   Press F2        :[id number]}  Dispo:  No follow-ups on file.  Signed, Denyce Robert, NP

## 2023-06-16 ENCOUNTER — Other Ambulatory Visit (HOSPITAL_COMMUNITY)
Admission: RE | Admit: 2023-06-16 | Discharge: 2023-06-16 | Disposition: A | Discharge status: Home / Self Care | Source: Ambulatory Visit | Attending: Obstetrics and Gynecology | Admitting: Obstetrics and Gynecology

## 2023-06-16 DIAGNOSIS — D069 Carcinoma in situ of cervix, unspecified: Secondary | ICD-10-CM | POA: Diagnosis present

## 2023-06-20 LAB — CYTOLOGY - PAP
Comment: NEGATIVE
Comment: NEGATIVE
Comment: NEGATIVE
HPV 16: NEGATIVE
HPV 18 / 45: NEGATIVE
High risk HPV: POSITIVE — AB

## 2023-07-09 ENCOUNTER — Other Ambulatory Visit: Payer: Self-pay | Admitting: Obstetrics and Gynecology

## 2023-07-10 LAB — SURGICAL PATHOLOGY
